# Patient Record
Sex: Female | Born: 1950 | Race: White | Hispanic: No | Marital: Single | Smoking: Never smoker
Health system: Southern US, Community
[De-identification: ages and names within clinical notes are randomized; demographics above are authoritative.]

## PROBLEM LIST (undated history)

## (undated) DIAGNOSIS — E785 Hyperlipidemia, unspecified: Secondary | ICD-10-CM

## (undated) DIAGNOSIS — F32A Depression, unspecified: Secondary | ICD-10-CM

## (undated) DIAGNOSIS — E079 Disorder of thyroid, unspecified: Secondary | ICD-10-CM

## (undated) DIAGNOSIS — I10 Essential (primary) hypertension: Secondary | ICD-10-CM

## (undated) DIAGNOSIS — K219 Gastro-esophageal reflux disease without esophagitis: Secondary | ICD-10-CM

## (undated) DIAGNOSIS — F329 Major depressive disorder, single episode, unspecified: Secondary | ICD-10-CM

## (undated) HISTORY — DX: Other disorders of phosphorus metabolism: E83.39

## (undated) HISTORY — DX: Disorder of thyroid, unspecified: E07.9

## (undated) HISTORY — PX: NASAL SINUS SURGERY: SHX719

## (undated) HISTORY — PX: KNEE SURGERY: SHX244

## (undated) HISTORY — PX: TONSILLECTOMY: SUR1361

## (undated) HISTORY — PX: APPENDECTOMY: SHX54

## (undated) HISTORY — PX: ABDOMINAL HYSTERECTOMY: SHX81

## (undated) HISTORY — DX: Essential (primary) hypertension: I10

## (undated) HISTORY — DX: Hyperlipidemia, unspecified: E78.5

---

## 2004-12-20 ENCOUNTER — Ambulatory Visit: Payer: Self-pay | Admitting: Family Medicine

## 2006-01-15 ENCOUNTER — Ambulatory Visit: Payer: Self-pay

## 2007-02-08 ENCOUNTER — Ambulatory Visit: Payer: Self-pay | Admitting: Family Medicine

## 2008-02-16 ENCOUNTER — Ambulatory Visit: Payer: Self-pay | Admitting: Family Medicine

## 2008-10-30 ENCOUNTER — Ambulatory Visit: Payer: Self-pay | Admitting: Family Medicine

## 2009-06-13 ENCOUNTER — Ambulatory Visit (HOSPITAL_COMMUNITY): Admission: RE | Admit: 2009-06-13 | Discharge: 2009-06-13 | Payer: Self-pay | Admitting: Surgery

## 2009-06-25 ENCOUNTER — Ambulatory Visit (HOSPITAL_COMMUNITY): Admission: RE | Admit: 2009-06-25 | Discharge: 2009-06-25 | Payer: Self-pay | Admitting: Surgery

## 2009-07-23 ENCOUNTER — Encounter: Admission: RE | Admit: 2009-07-23 | Discharge: 2009-07-23 | Payer: Self-pay | Admitting: Surgery

## 2010-03-07 ENCOUNTER — Encounter
Admission: RE | Admit: 2010-03-07 | Discharge: 2010-03-12 | Payer: Self-pay | Source: Home / Self Care | Attending: Surgery | Admitting: Surgery

## 2010-03-21 ENCOUNTER — Ambulatory Visit (HOSPITAL_COMMUNITY)
Admission: RE | Admit: 2010-03-21 | Discharge: 2010-03-21 | Disposition: A | Discharge status: Home / Self Care | Payer: BC Managed Care – HMO | Source: Ambulatory Visit | Attending: Surgery | Admitting: Surgery

## 2010-03-21 ENCOUNTER — Encounter (HOSPITAL_COMMUNITY): Payer: BC Managed Care – HMO

## 2010-03-21 DIAGNOSIS — Z01818 Encounter for other preprocedural examination: Secondary | ICD-10-CM | POA: Insufficient documentation

## 2010-03-21 LAB — DIFFERENTIAL
Basophils Absolute: 0 10*3/uL (ref 0.0–0.1)
Basophils Relative: 0 % (ref 0–1)
Eosinophils Relative: 3 % (ref 0–5)
Lymphocytes Relative: 28 % (ref 12–46)
Lymphs Abs: 2 10*3/uL (ref 0.7–4.0)
Monocytes Absolute: 0.7 10*3/uL (ref 0.1–1.0)
Monocytes Relative: 10 % (ref 3–12)
Neutrophils Relative %: 59 % (ref 43–77)

## 2010-03-21 LAB — COMPREHENSIVE METABOLIC PANEL
ALT: 26 U/L (ref 0–35)
AST: 25 U/L (ref 0–37)
Albumin: 4.5 g/dL (ref 3.5–5.2)
CO2: 29 mEq/L (ref 19–32)
Creatinine, Ser: 0.83 mg/dL (ref 0.4–1.2)
GFR calc non Af Amer: >60 mL/min (ref >60)
Glucose, Bld: 85 mg/dL (ref 70–99)
Total Bilirubin: 0.7 mg/dL (ref 0.3–1.2)
Total Protein: 7.6 g/dL (ref 6.0–8.3)

## 2010-03-21 LAB — SURGICAL PCR SCREEN
MRSA, PCR: NEGATIVE
Staphylococcus aureus: NEGATIVE

## 2010-03-21 LAB — CBC
HCT: 41.2 % (ref 36.0–46.0)
Hemoglobin: 14.7 g/dL (ref 12.0–15.0)
MCHC: 35.7 g/dL (ref 30.0–36.0)
MCV: 87.7 fL (ref 78.0–100.0)

## 2010-03-26 ENCOUNTER — Ambulatory Visit (HOSPITAL_COMMUNITY)
Admission: RE | Admit: 2010-03-26 | Discharge: 2010-03-27 | Disposition: A | Discharge status: Home / Self Care | Payer: BC Managed Care – HMO | Attending: Surgery | Admitting: Surgery

## 2010-03-26 DIAGNOSIS — K219 Gastro-esophageal reflux disease without esophagitis: Secondary | ICD-10-CM | POA: Insufficient documentation

## 2010-03-26 DIAGNOSIS — K449 Diaphragmatic hernia without obstruction or gangrene: Secondary | ICD-10-CM | POA: Insufficient documentation

## 2010-03-26 DIAGNOSIS — M129 Arthropathy, unspecified: Secondary | ICD-10-CM | POA: Insufficient documentation

## 2010-03-26 DIAGNOSIS — Z8543 Personal history of malignant neoplasm of ovary: Secondary | ICD-10-CM | POA: Insufficient documentation

## 2010-03-26 DIAGNOSIS — Z6837 Body mass index (BMI) 37.0-37.9, adult: Secondary | ICD-10-CM | POA: Insufficient documentation

## 2010-03-26 DIAGNOSIS — K432 Incisional hernia without obstruction or gangrene: Secondary | ICD-10-CM | POA: Insufficient documentation

## 2010-03-26 DIAGNOSIS — J40 Bronchitis, not specified as acute or chronic: Secondary | ICD-10-CM | POA: Insufficient documentation

## 2010-03-26 DIAGNOSIS — E039 Hypothyroidism, unspecified: Secondary | ICD-10-CM | POA: Insufficient documentation

## 2010-03-26 DIAGNOSIS — Z87442 Personal history of urinary calculi: Secondary | ICD-10-CM | POA: Insufficient documentation

## 2010-03-26 DIAGNOSIS — I10 Essential (primary) hypertension: Secondary | ICD-10-CM | POA: Insufficient documentation

## 2010-03-26 DIAGNOSIS — E7800 Pure hypercholesterolemia, unspecified: Secondary | ICD-10-CM | POA: Insufficient documentation

## 2010-03-27 ENCOUNTER — Observation Stay (HOSPITAL_COMMUNITY): Payer: BC Managed Care – HMO

## 2010-03-27 LAB — DIFFERENTIAL
Monocytes Relative: 10 % (ref 3–12)
Neutro Abs: 7 10*3/uL (ref 1.7–7.7)
Neutrophils Relative %: 70 % (ref 43–77)

## 2010-03-27 LAB — CBC: RBC: 4.1 MIL/uL (ref 3.87–5.11)

## 2010-04-08 ENCOUNTER — Encounter: Payer: BC Managed Care – HMO | Attending: Surgery | Admitting: *Deleted

## 2010-04-08 DIAGNOSIS — Z09 Encounter for follow-up examination after completed treatment for conditions other than malignant neoplasm: Secondary | ICD-10-CM | POA: Insufficient documentation

## 2010-04-08 DIAGNOSIS — Z9884 Bariatric surgery status: Secondary | ICD-10-CM | POA: Insufficient documentation

## 2010-04-08 DIAGNOSIS — Z713 Dietary counseling and surveillance: Secondary | ICD-10-CM | POA: Insufficient documentation

## 2010-04-23 NOTE — Discharge Summary (Signed)
[name], [name]              ACCOUNT NO.:  [id number]  MEDICAL RECORD NO.:  [id number]           PATIENT TYPE:  O  LOCATION:  XRAY                         FACILITY:  [facility]  PHYSICIAN:  Sandria Bales. Ezzard Standing, M.D.  DATE OF BIRTH:  1951-01-06  DATE OF ADMISSION:  03/21/2010 DATE OF DISCHARGE:  03/22/2010                              DISCHARGE SUMMARY  DISCHARGE DIAGNOSES: 1. Morbid obesity with a weight of 180, BMI of 37.6. 2. History of X-linked hypophosphatemia. 3. Hypothyroidism. 4. Hypertension. 5. Hypercholesterolemia. 6. Gastroesophageal reflux associated with bronchitis. 7. History kidney stones. 8. Arthritis. 9. History of ovarian cancer. 10.Ventral incisional hernia. 11.Hiatal hernia.  OPERATIONS PERFORMED:  The patient had a Lap-Band with an AP standard band and a hiatal hernia repair by Dr. Algis Downs. Ezzard Standing on March 26, 2010.  HISTORY OF ILLNESS:  [name] is a 59 year old white female who sees Dr. Lorie Phenix as her primary medical doctor and Dr. Tonia Brooms of [facility] for management of her X-linked hypophosphatemia.  She has been through our preop bariatric program and is interested in a Lap-Band and we have discussed with her the indications and potential complications of this surgery.  COMORBID CONDITIONS: 1. She has X-linked hypophosphatemia or XLH, followed by Dr. Jolaine Click     in [place]. 2. She has hypothyroidism. 3. She has hypertension. 4. She has hypercholesterolemia. 5. She has bronchitis felt to be possibly secondary to     gastroesophageal reflux. 6. She has history of kidney stones. 7. She has arthritis. 8. She has had history ovarian cancer for which she is disease free at     this time.  HOSPITAL COURSE:  She is taken to the operating room on the day of admission where she underwent a laparoscopic band placement with an AP standard band.  She was found to have a hiatal hernia at that time and had 2 posterior sutures for  posterior repair of her hiatus.  She also had a small ventral incisional hernia through her low midline incision. These ventral incisional hernias were left alone until they are either symptomatic or cause more trouble.  Postoperatively, she has done well.  She is now 1 day postop.  Her hemoglobin is 12, white blood count is 10,000.  Her KUB showed good position of the band.  She is having a little bit of nausea but otherwise keeping liquids down.  We are going to give her some protein before she leaves and then let her go home later today.  DISCHARGE INSTRUCTIONS:  No driving for 3-4 days.  She will be out of work for about 2 weeks.  She is to go home on a postop band diet with advancing her diet by nutrition.  She has appointment for nutrition in about 2 weeks.  She can shower starting tomorrow.   She is to see me back in 2 weeks. She will be given Roxicet for pain to go home with and Zofran for nausea.  DISCHARGE MEDICATIONS:  She is to resume her home medications which include: 1. Menest 1.5. 2. Estrogen. 3. She is on vitamins. 4. She will take her  triamterene hydrochlorothiazide. 5. Synthroid 125 mcg daily. 6. Simvastatin 20 mg daily. 7. Fenofibrate 135 mg daily.  I will defre to Dr. Jolaine Click who will be managing her calcium and phosphorus requirements because of her XLH history.   Sandria Bales. Ezzard Standing, M.D., FACS   DHN/MEDQ  D:  03/27/2010  T:  03/27/2010  Job:  [id number]  cc:   Lorie Phenix Fax: [phone]  Dr. Tonia Brooms  Vilinda Flake, Ph.D. Fax: [phone]  Electronically Signed by Ovidio Kin M.D. on 04/23/2010 29:56:21 PM

## 2010-04-23 NOTE — Op Note (Signed)
[name], [name]              ACCOUNT NO.:  [id number]  MEDICAL RECORD NO.:  [id number]           PATIENT TYPE:  O  LOCATION:  XRAY                         FACILITY:  [facility]  PHYSICIAN:  Sandria Bales. Ezzard Standing, M.D.  DATE OF BIRTH:  1951/01/31  DATE OF PROCEDURE:  03/21/2010                              OPERATIVE REPORT   PREOPERATIVE DIAGNOSES:  Morbid obesity.  Weight 179.  Body Mass Index 37.6.  Hiatal hernia.  POSTOPERATIVE DIAGNOSES:  Morbid obesity (Weight 179, Body Mass Index 37.6), hiatal hernia, ventral incisional hernia.  PROCEDURE:  Laparoscopic band placement with an AP standard band, subcutaneous placement of the port, hiatal hernia repair with posterior crural closure.  SURGEON:  Sandria Bales. Ezzard Standing, M.D.  FIRST ASSISTANT:  Mary Sella. Andrey Campanile, M.D.  ANESTHESIA:  General endotracheal.  ESTIMATED BLOOD LOSS:  Minimal.  INDICATION FOR PROCEDURE:  Ms. Salado is a 60 year old white female who sees Dr. Vic Ripper as her primary medical doctor and Dr. Tonia Brooms at [facility] for management of X-linked hypophosphatemia.  She has been obese or morbidly obese much of her adult life.  Completed our preoperative bariatric program and is interested in laparoscopic band placement.  I discussed with her about the placement of the laparoscopic band.  The risks of surgery include bleeding, infection, bowel injury, slippage, erosion of the band and long-term nutritional consequences.  She was also noted to have a hiatal hernia on her preoperative evaluation.  She does have symptoms of reflux and therefore we will look at repairing this at the same time as we place the band.  Also of note, she has X-linked hypophosphatemia that is followed by Dr. Jolaine Click and we were in discussion about postoperative management of her nutrition and I will defer to Dr. Jolaine Click to manage her oral electrolytes, such as calcium and phosphorus, in her postoperative course.  OPERATIVE NOTE:  The  patient was placed in the supine position and given a general endotracheal anesthetic supervised by Dr. Lucille Passy. She was given 1 gram of Ancef at the initiation of the procedure and had PAS stockings in place.  Her abdomen was prepped with ChloraPrep and sterilely draped.    A time-out was held and the surgical checklist run.  I made a left upper quadrant incision and using the 11-mm Ethicon Optiview trocar, I inserted this into the abdominal cavity.  I then carried out an abdominal exploration.  The right and left lobes of the liver were unremarkable.  The stomach was unremarkable.  She did have some adhesions to her anterior abdominal wall.  She also had a small hernia near the umbilicus and near the upper end of her previous midline incision.  I then placed five additional trocars of 5-mm subxiphoid, a trocar for the liver retractor, a 15-mm right subcostal trocar for band placement, an 11-mm left paramedian trocar, an 11-mm right paramedian trocar for camera placement and a 5-mm left lateral trocar for my assistant to have two hands.  Even though we identified the ventral hernias, I will leave these alone until they either become symptomatic or she has lost weight.  I did not  think that taking down the adhesions and fixing these at this time would be appropriate.  I then turned my attention to her stomach with the liver retractor in the left lobe of the liver.  She does have a dimple at her hiatus consistent with a hiatal hernia.  Anesthesia passed the sizing tube down into the stomach.  We blew this up with 15 cc of air and this was then pulled up through at the esophageal hiatus and went up into the esophagus, so she had a fairly good-sized hiatal hernia.  So I then started dissection along the right crus.  She had some fat which had protruded up into the chest and I reduced this.  I actually resected some of this fat.  I then identified the right and left crus and  I placed two sutures using 2-0 Ethibond suture with tie knots to hold the sutures in place.  This then closed the hiatus down.  I then reinserted the sizing tube into the stomach and insufflated this with 15 cc of air.  At this time the sizing tube held up at the hiatus, consistent with an adequate closure of her esophageal or diaphragmatic hiatus.  I then used an AP standard band.  I did defat some of the anterior stomach for exposure purposes and to get this out of the way.  I used a finger dissector and passed this around the proximal stomach and then captured the laparoscopic band and pulled this around the stomach.  This was then tightened over the sizing tube.  The sizing tube was then removed.  I then imbricated the stomach along the left lateral aspect of the band at three sites using 2-0 Ethibond suture.  The band lay in a good position.  Photos were taken.  The hiatal hernia repair looked good.  The Silastic tube was then brought out through the right paramedian incision.  I removed the liver retractor.  I inspected the abdomen, which looked good, and all of the trocars were removed.    I then developed a pocket lateral to the right abdominal incision and attached the port to the tubing of the band.  The port had mesh backing sewn in place.  I then closed that incision with interrupted 2-0 Vicryl sutures and then closed the skin at each site with a 5-0 Vicryl suture.  I painted this with a combination of Octylseal and Dermabond.  The patient tolerated the procedure well and was transported to the recovery room in good condition.   Sandria Bales. Ezzard Standing, M.D., FACS   DHN/MEDQ  D:  03/26/2010  T:  03/26/2010  Job:  [id number]  cc:   Lorie Phenix Fax: [phone]  Vilinda Flake, Ph.D. Fax: [phone]  Tonia Brooms, M.D.  Electronically Signed by Ovidio Kin M.D. on 04/23/2010 29:56:21 PM

## 2010-05-21 ENCOUNTER — Encounter: Payer: BC Managed Care – HMO | Attending: Surgery | Admitting: *Deleted

## 2010-05-21 DIAGNOSIS — Z09 Encounter for follow-up examination after completed treatment for conditions other than malignant neoplasm: Secondary | ICD-10-CM | POA: Insufficient documentation

## 2010-05-21 DIAGNOSIS — Z9884 Bariatric surgery status: Secondary | ICD-10-CM | POA: Insufficient documentation

## 2010-05-21 DIAGNOSIS — Z713 Dietary counseling and surveillance: Secondary | ICD-10-CM | POA: Insufficient documentation

## 2010-06-28 IMAGING — US US ABDOMEN COMPLETE
1 series · 14 of 25 positions shown · non-contrast
Comparison: None.

CLINICAL DATA: Morbid obesity, pre bariatric surgery screening
exam.

COMPLETE ABDOMINAL ULTRASOUND

[Series 1: us abdomen complete · 0.20mm/px · 14 of 71 slices shown]
[im 1/71]
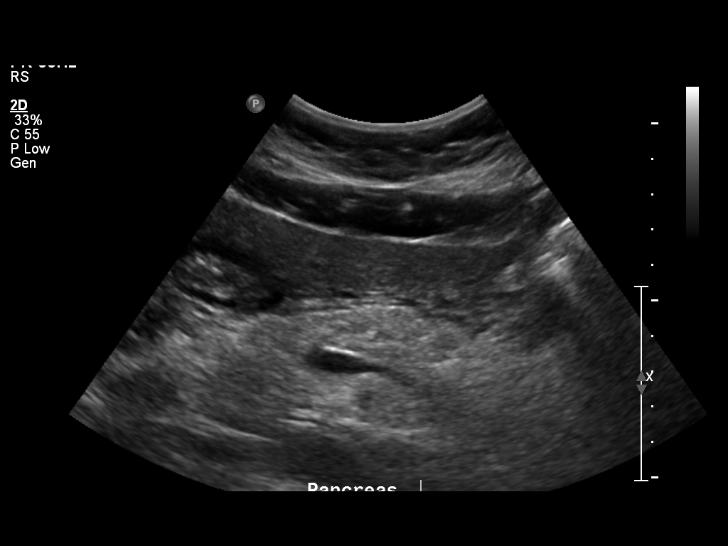
[im 6/71]
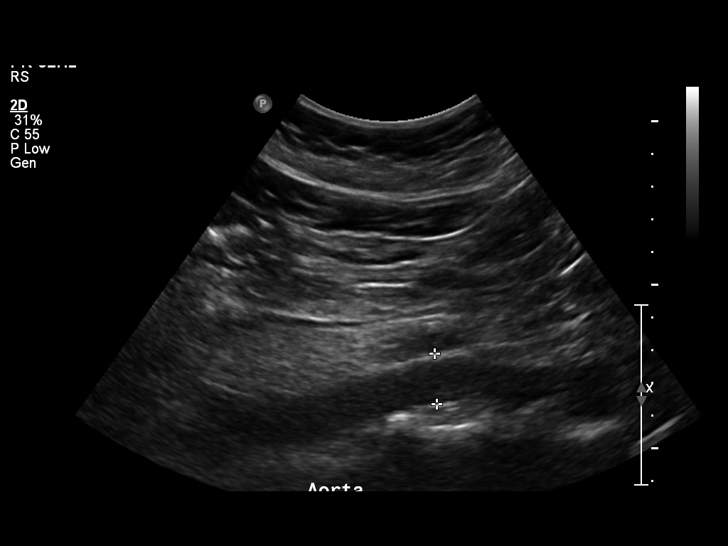
[im 12/71]
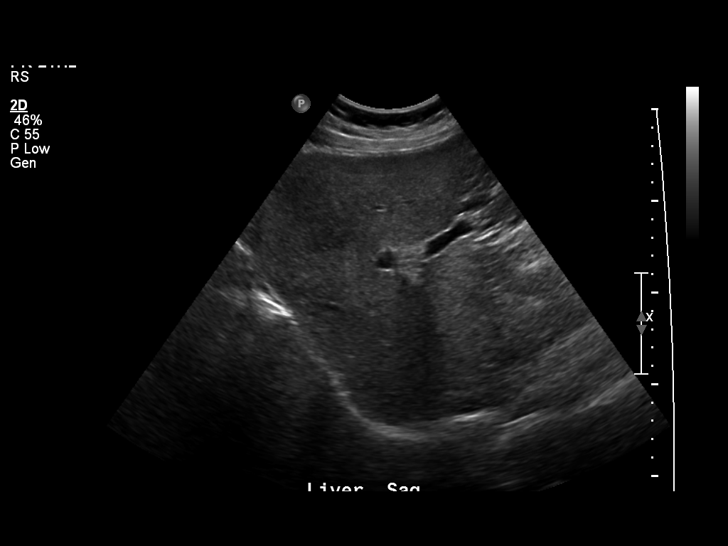
[im 18/71]
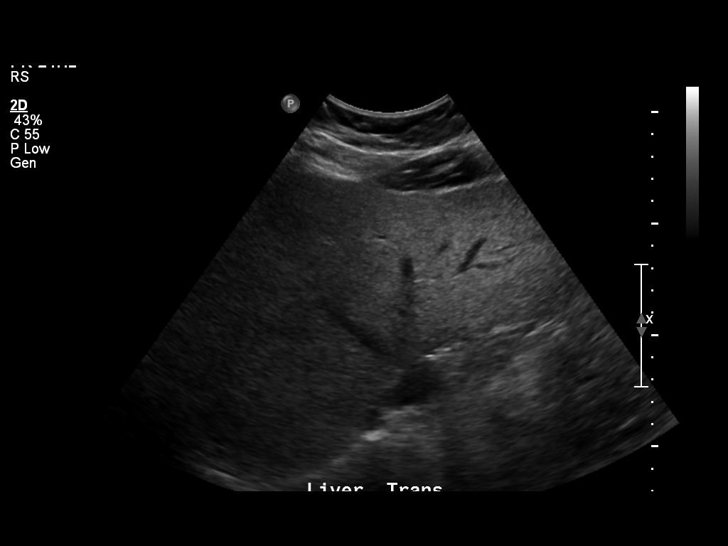
[im 24/71]
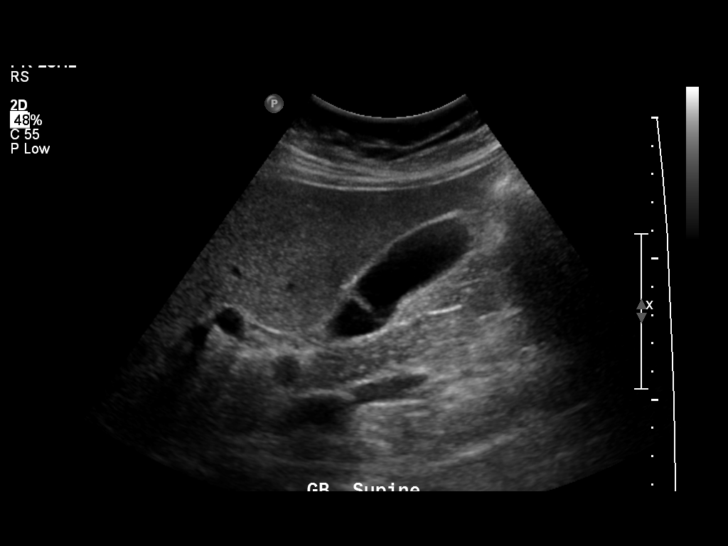
[im 27/71]
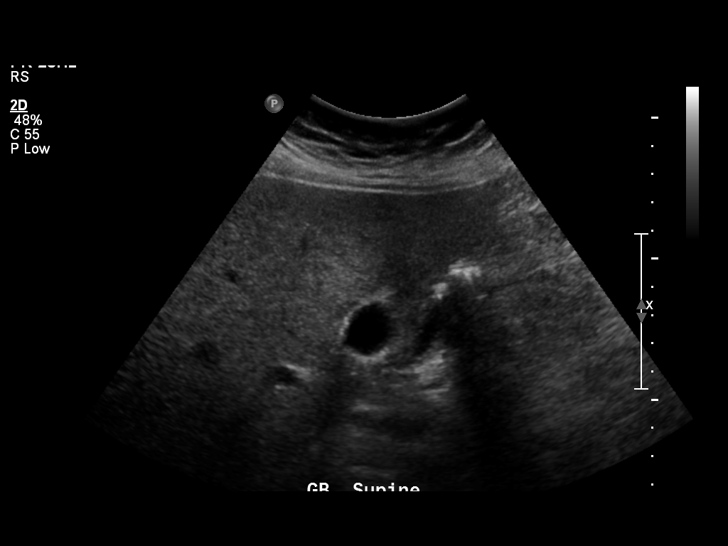
[im 33/71]
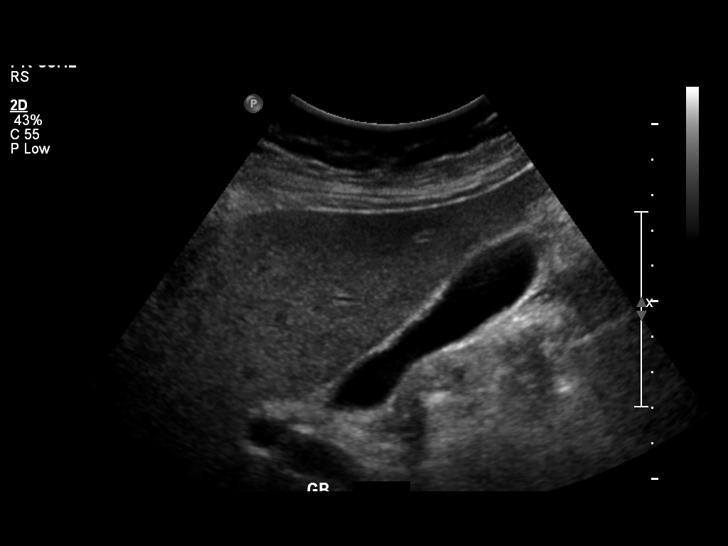
[im 38/71]
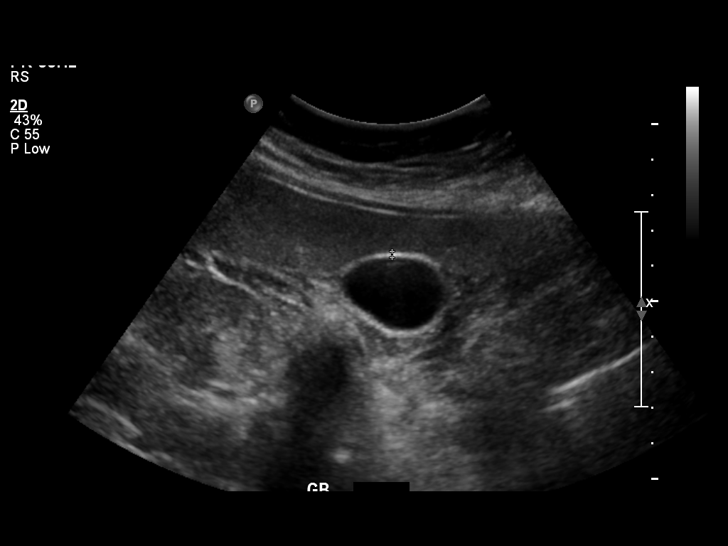
[im 44/71]
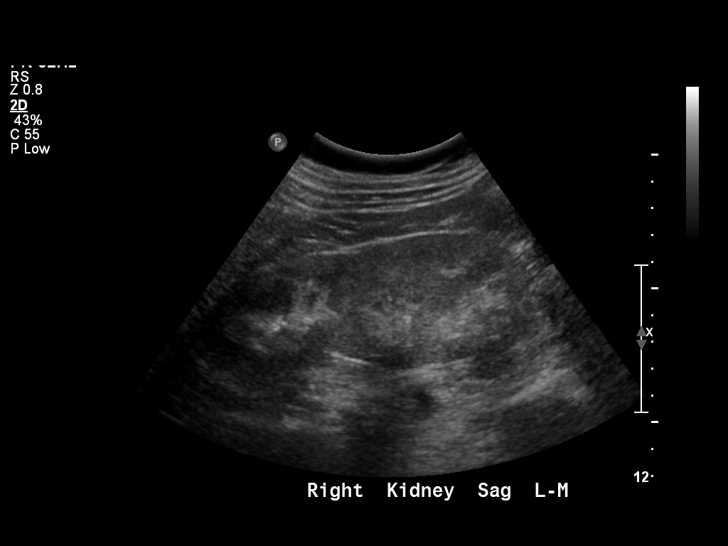
[im 47/71]
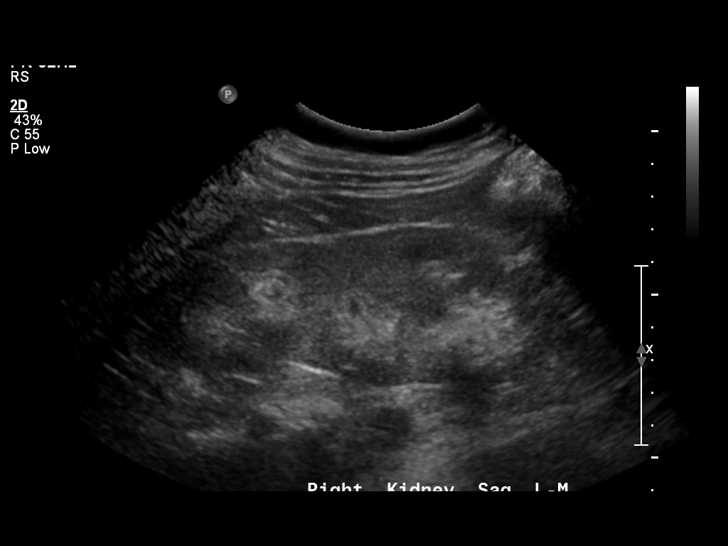
[im 53/71]
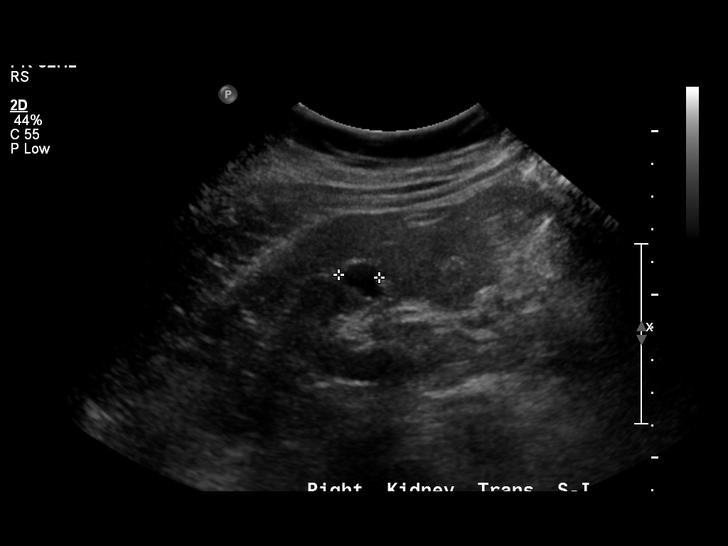
[im 59/71]
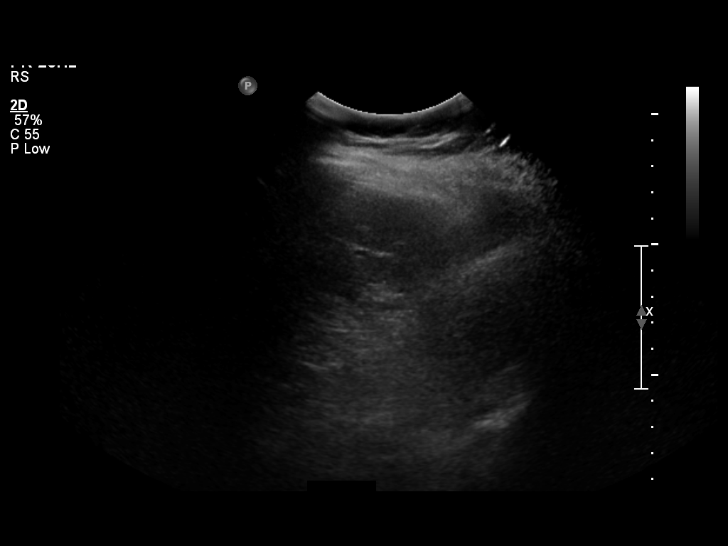
[im 65/71]
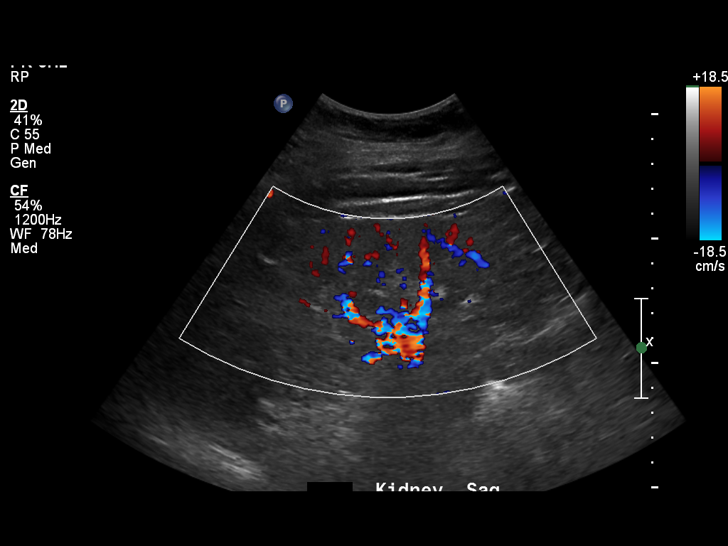
[im 71/71]
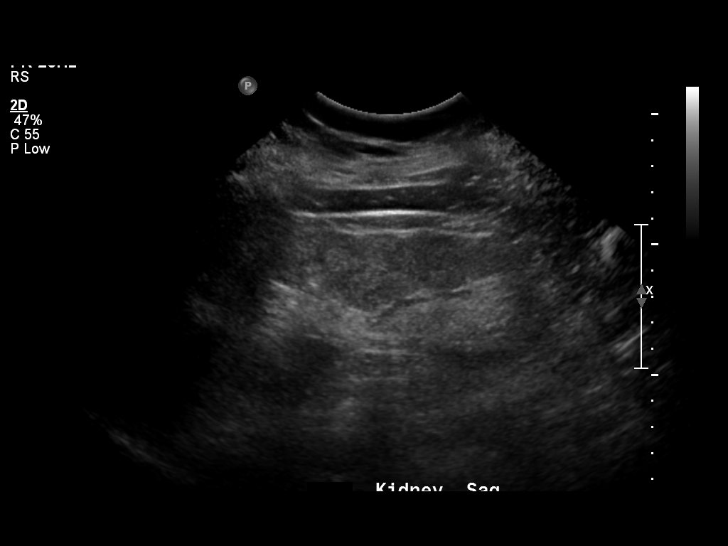

[14 of 25 positions shown; findings below may reference images not displayed]

FINDINGS: Gallbladder:  No gallstones, gallbladder wall thickening, or
pericholecystic fluid.

Common bile duct:  4 mm.

Liver:  The liver is increased in echogenicity with poor sound
through transmission, suggestive of fatty infiltration.

IVC:  Appears normal.

Pancreas:  No focal abnormality seen.

Spleen:  4.2 cm.  Normal.

Right Kidney:  12.4 cm.  1.3 x 1.2 x 1.2 cm simple appearing right
upper renal pole cyst.

Left Kidney:  12.9 cm.  No hydronephrosis or focal mass.

Both renal cortices are preserved but increased in echogenicity.

Abdominal aorta:  No aneurysm identified.
IMPRESSION: No acute abnormality.

Fatty liver.

Mildly increased renal cortical echogenicity without hydronephrosis
or cortical thinning.  This could signify medical renal disease but
is nonspecific.

## 2010-07-02 ENCOUNTER — Encounter: Payer: BC Managed Care – PPO | Attending: Obstetrics | Admitting: *Deleted

## 2010-07-02 DIAGNOSIS — Z09 Encounter for follow-up examination after completed treatment for conditions other than malignant neoplasm: Secondary | ICD-10-CM | POA: Insufficient documentation

## 2010-07-02 DIAGNOSIS — Z9884 Bariatric surgery status: Secondary | ICD-10-CM | POA: Insufficient documentation

## 2010-07-02 DIAGNOSIS — Z713 Dietary counseling and surveillance: Secondary | ICD-10-CM | POA: Insufficient documentation

## 2010-08-09 ENCOUNTER — Ambulatory Visit (INDEPENDENT_AMBULATORY_CARE_PROVIDER_SITE_OTHER): Payer: BC Managed Care – PPO | Admitting: Physician Assistant

## 2010-08-09 ENCOUNTER — Encounter (INDEPENDENT_AMBULATORY_CARE_PROVIDER_SITE_OTHER): Payer: Self-pay

## 2010-08-09 VITALS — BP 118/78 | Ht <= 58 in | Wt 168.2 lb

## 2010-08-09 DIAGNOSIS — N951 Menopausal and female climacteric states: Secondary | ICD-10-CM

## 2010-08-09 DIAGNOSIS — Z4651 Encounter for fitting and adjustment of gastric lap band: Secondary | ICD-10-CM

## 2010-08-09 DIAGNOSIS — Z780 Asymptomatic menopausal state: Secondary | ICD-10-CM | POA: Insufficient documentation

## 2010-08-09 DIAGNOSIS — Z79890 Hormone replacement therapy (postmenopausal): Secondary | ICD-10-CM

## 2010-08-09 NOTE — Progress Notes (Signed)
Subjective:     Patient ID: [name], female   DOB: Apr 10, 1950, 60 y.o.   MRN: [id number]    BP 118/78  Ht 4\' 10"  (1.473 m)  Wt 168 lb 3.2 oz (76.295 kg)  BMI 35.15 kg/m2    HPI See scanned, dictated note  Review of Systems     Objective:   Physical Exam     Assessment:         Plan:

## 2010-09-13 ENCOUNTER — Encounter (INDEPENDENT_AMBULATORY_CARE_PROVIDER_SITE_OTHER): Payer: Self-pay

## 2010-09-13 ENCOUNTER — Ambulatory Visit (INDEPENDENT_AMBULATORY_CARE_PROVIDER_SITE_OTHER): Payer: BC Managed Care – PPO | Admitting: Physician Assistant

## 2010-09-13 VITALS — BP 130/78

## 2010-09-13 DIAGNOSIS — Z4651 Encounter for fitting and adjustment of gastric lap band: Secondary | ICD-10-CM

## 2010-09-13 NOTE — Progress Notes (Signed)
  HISTORY: [name] is a 60 y.o.female who received an AP-Standard lap-band in February 2012 by Dr. Ezzard Standing. She comes in today stating that for the most part her hunger is under control and her portion sizes are small but she doesn't feel the "signal" to stop eating as he feels she should. She depends on limiting the amount of food on her plate rather than a feeling of fullness to stop eating. She denies vomiting or regurgitation symptoms.  VITAL SIGNS: Filed Vitals:   09/13/10 0920  BP: 130/78    PHYSICAL EXAM: Physical exam reveals a very well-appearing 60 y.o.female in no apparent distress Neurologic: Awake, alert, oriented Psych: Bright affect, conversant Respiratory: Breathing even and unlabored. No stridor or wheezing Abdomen: Soft, nontender, nondistended to palpation. Incisions well-healed. No incisional hernias. Port easily palpated. Extremities: Atraumatic, good range of motion.  ASSESMENT: 60 y.o.  female  s/p AP-Standard lap-band. We will try an adjustment today.  PLAN: The patient's port was accessed with a 20G Huber needle without difficulty. Clear fluid was aspirated and 0.5 mL saline was added to the port to give a total volume of 5.5 mL. The patient was able to swallow water without difficulty following the procedure and was instructed to take clear liquids for the next 24-48 hours and advance slowly as tolerated.

## 2010-09-13 NOTE — Patient Instructions (Signed)
Take clear liquids for the next 48 hours. Thin protein shakes are ok to start on Saturday evening. Call us if you have persistent vomiting or regurgitation, night cough or reflux symptoms. Return as scheduled or sooner if you notice no changes in hunger/portion sizes.   

## 2010-10-01 ENCOUNTER — Ambulatory Visit: Payer: BC Managed Care – HMO | Admitting: *Deleted

## 2010-10-11 ENCOUNTER — Encounter (INDEPENDENT_AMBULATORY_CARE_PROVIDER_SITE_OTHER): Payer: BC Managed Care – PPO

## 2010-10-23 ENCOUNTER — Ambulatory Visit: Payer: Self-pay | Admitting: *Deleted

## 2010-11-12 ENCOUNTER — Ambulatory Visit: Payer: Self-pay | Admitting: *Deleted

## 2011-04-05 IMAGING — CR DG CHEST 2V
2 series · 2 of 2 positions shown · non-contrast
Comparison: None.

CLINICAL DATA: Preoperative respiratory film.

CHEST - 2 VIEW

[w chest pa]
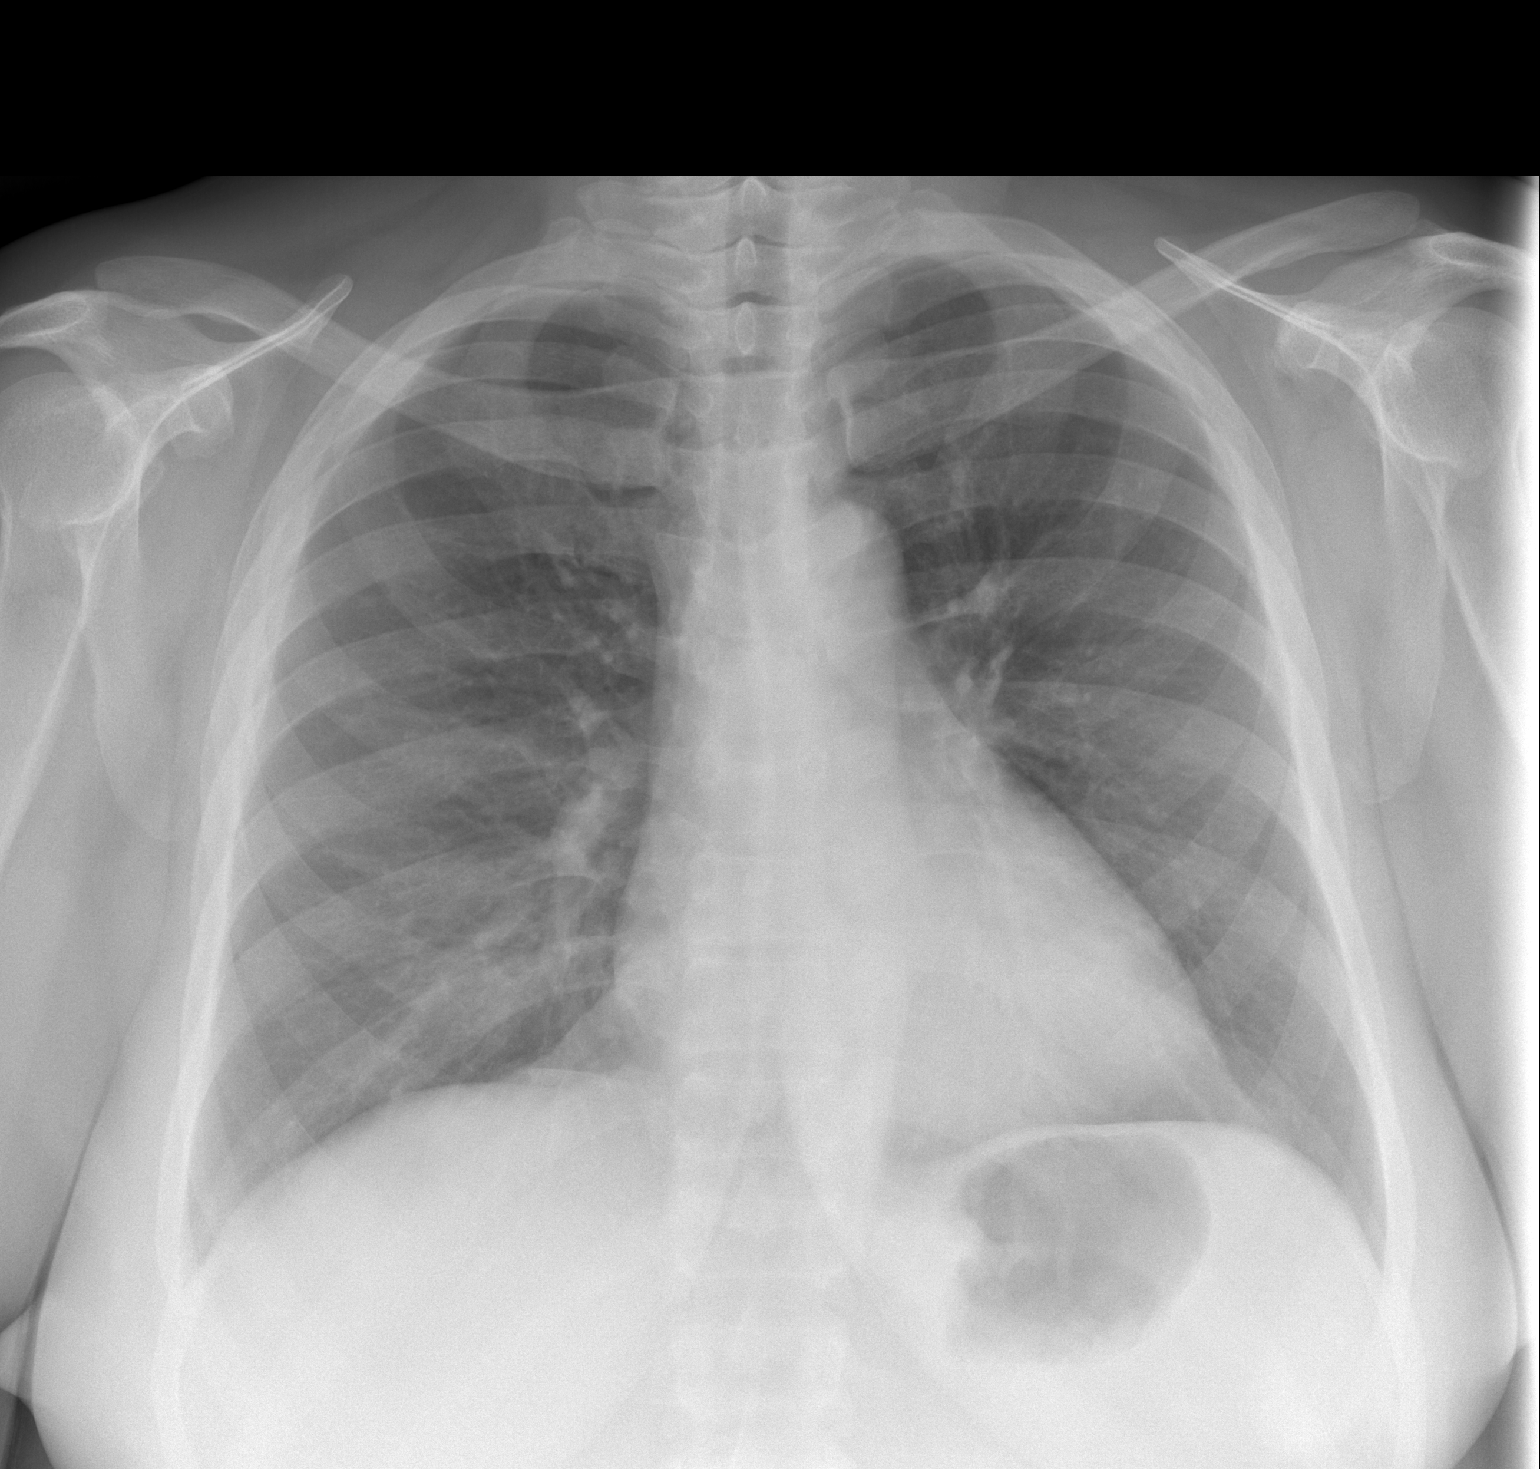

[w chest lat]
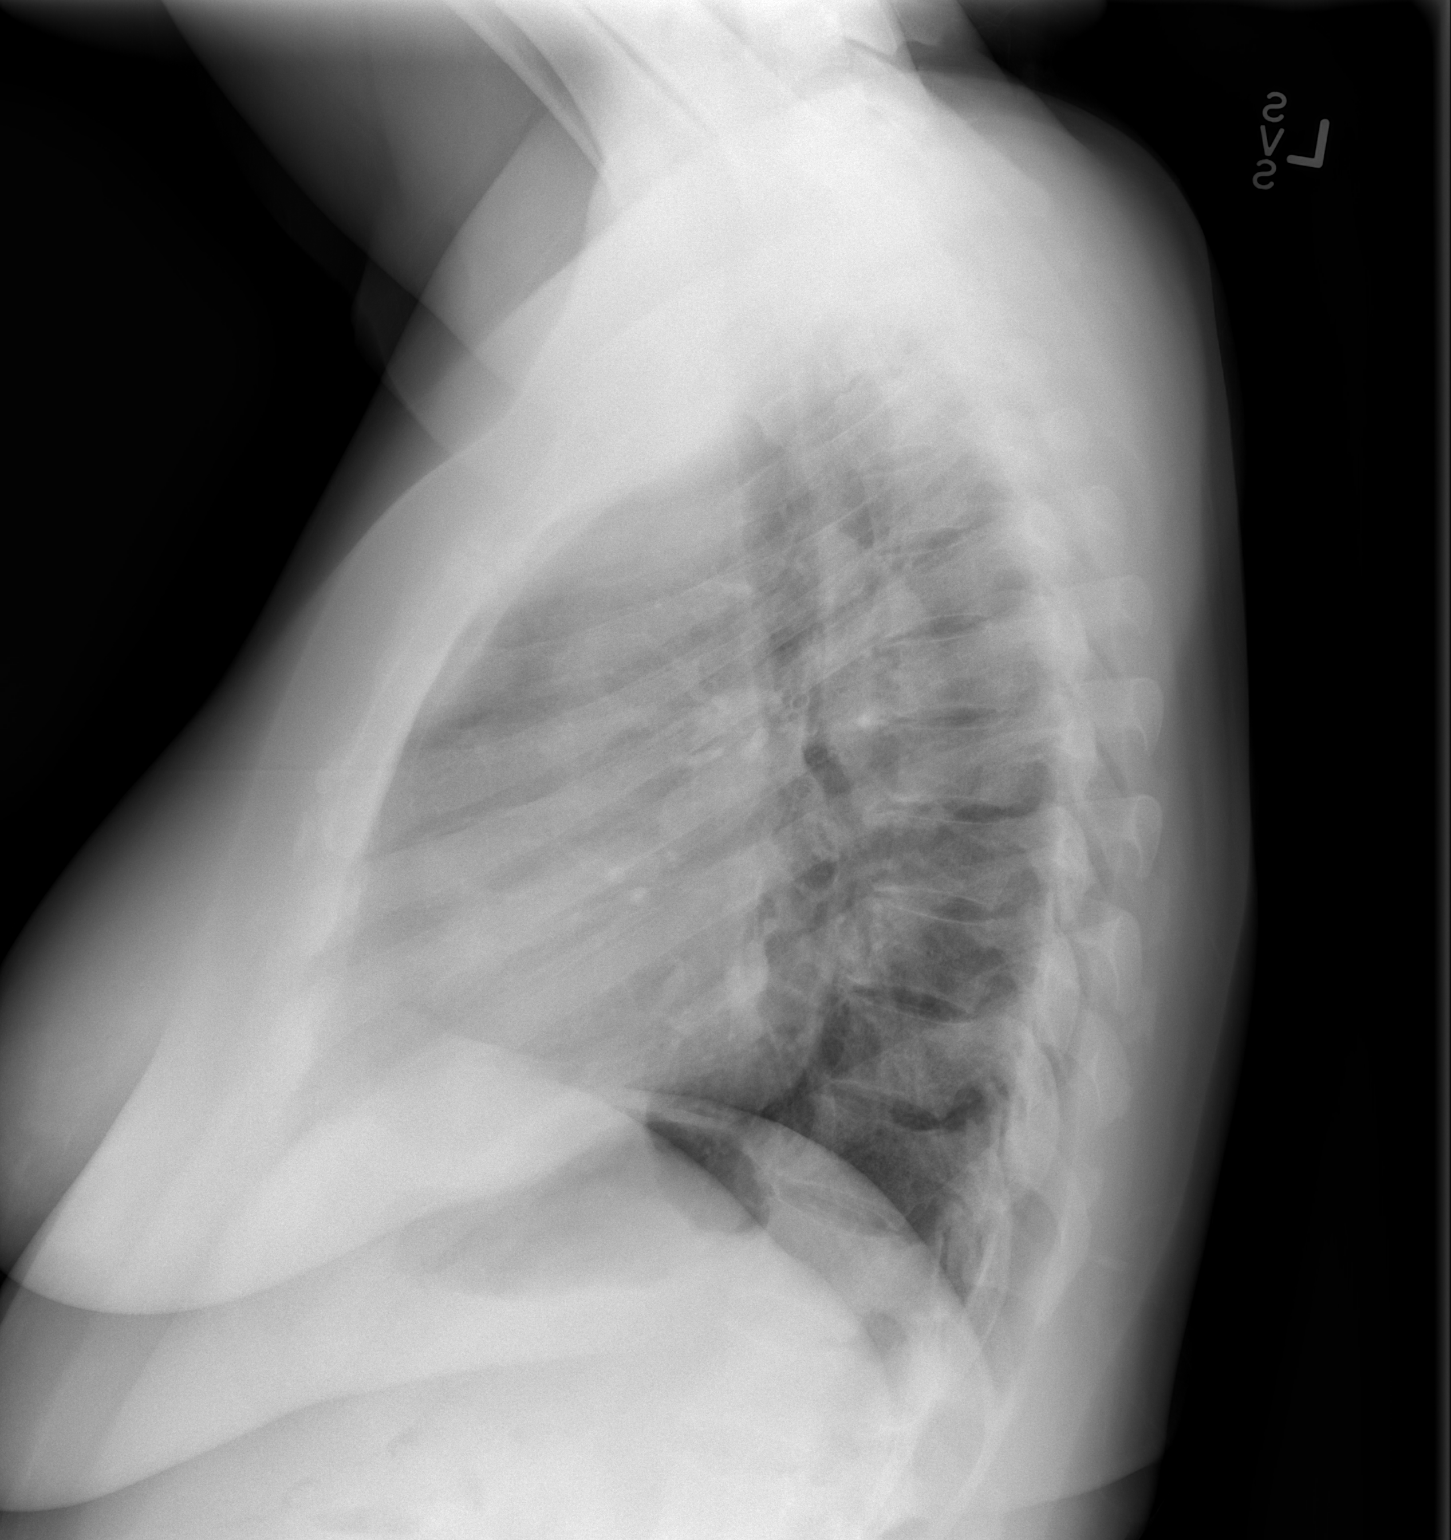

[2 of 2 positions shown; findings below may reference images not displayed]

FINDINGS: Lungs clear.  No pneumothorax or pleural effusion.  Heart
size normal.  No focal bony abnormality.
IMPRESSION: Negative chest.

## 2011-04-11 IMAGING — CR DG ABDOMEN 1V
1 series · 1 of 1 positions shown · non-contrast
Comparison: None

CLINICAL DATA: Status post gastric lap band placement.

ABDOMEN - 1 VIEW

[t abdomen supine]
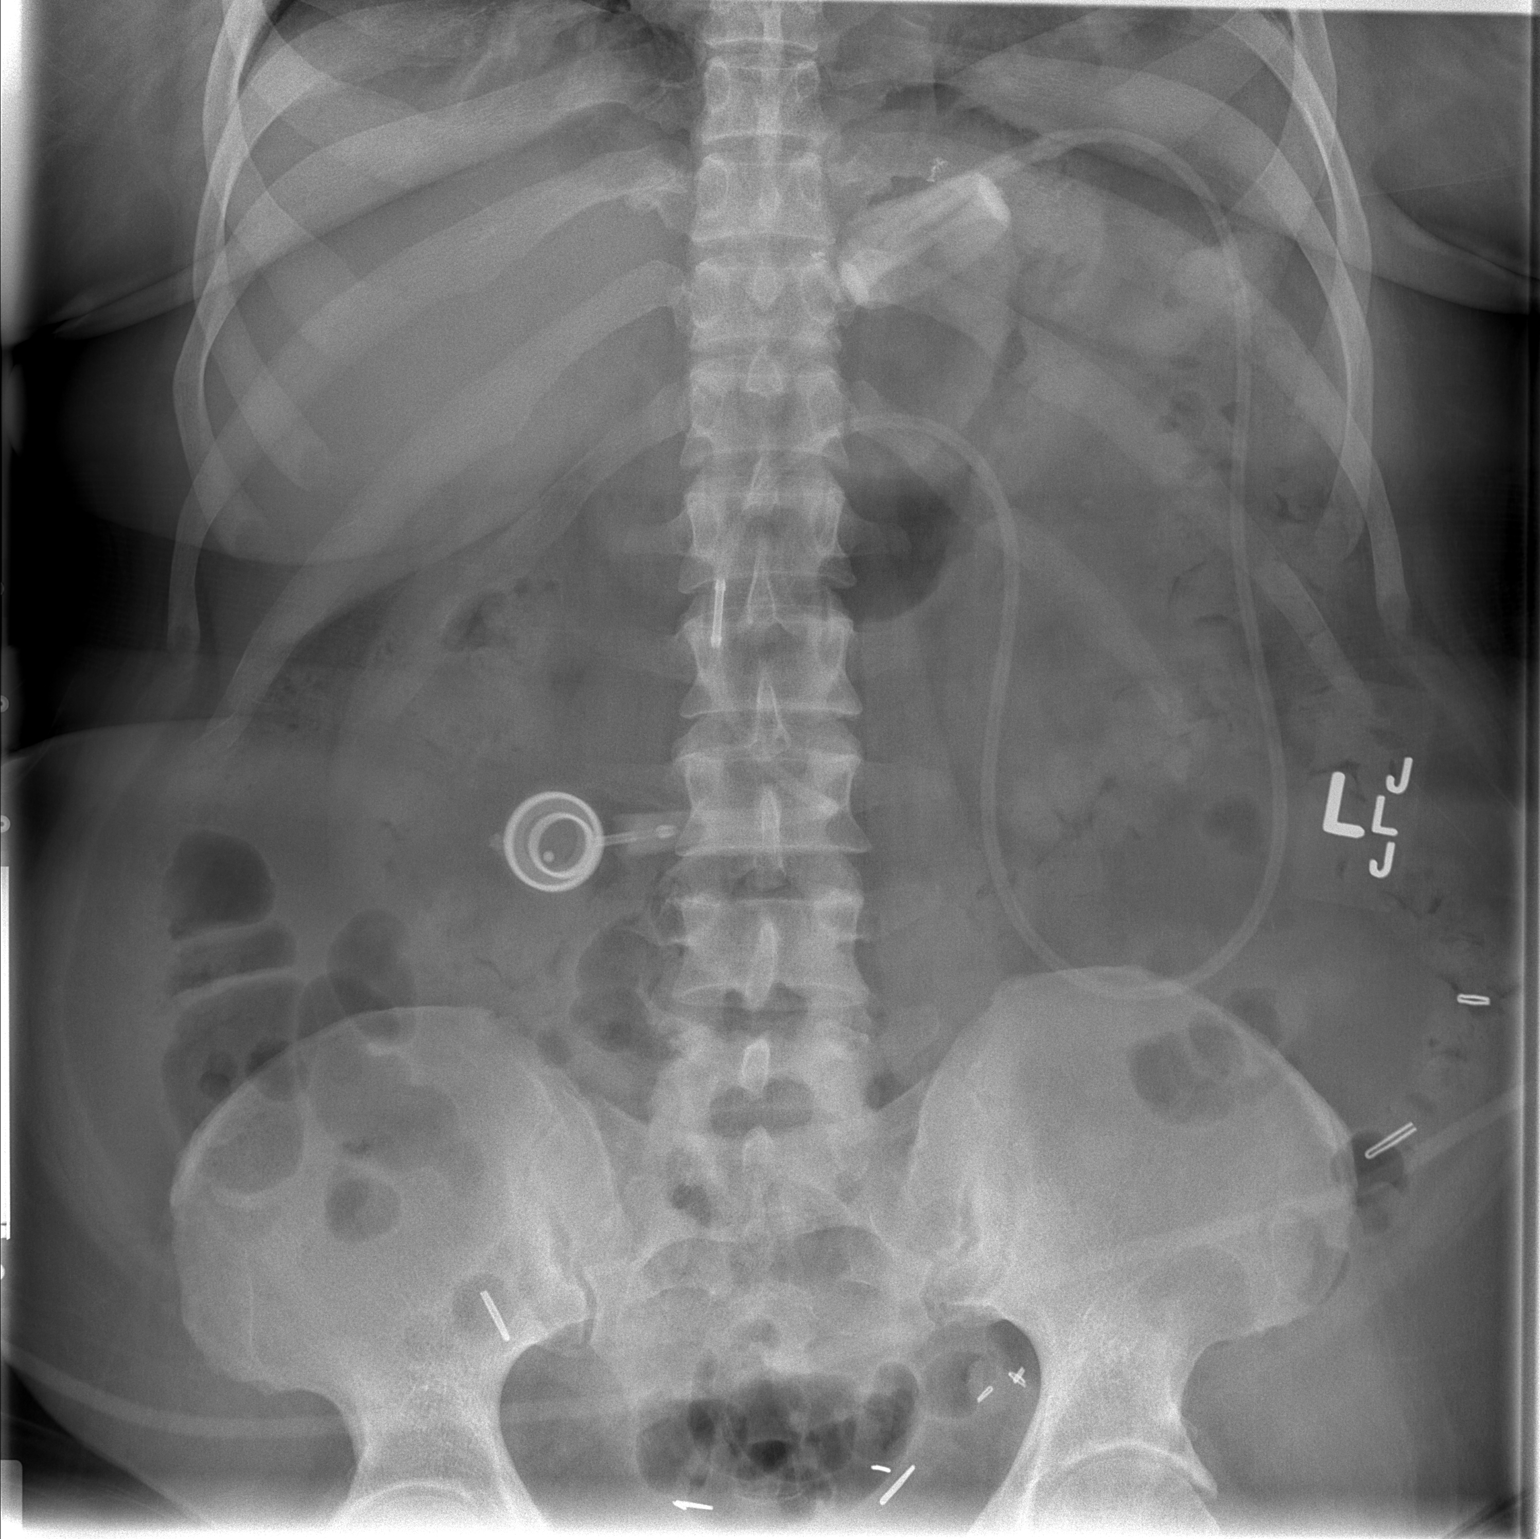

[1 of 1 positions shown; findings below may reference images not displayed]

FINDINGS: The lap band is and good position and demonstrates normal
2 o'clock 8 o'clock orientation.  The bowel gas pattern is
unremarkable.  The soft tissue shadows of the abdomen are
maintained.  Minimal right basilar atelectasis.
IMPRESSION: Lap band in appropriate position with normal orientation.

## 2011-10-31 ENCOUNTER — Telehealth (INDEPENDENT_AMBULATORY_CARE_PROVIDER_SITE_OTHER): Payer: Self-pay | Admitting: Surgery

## 2011-10-31 NOTE — Telephone Encounter (Signed)
I spoke with the patient via phone. She has been taking care of her mother who passed away this week. The patient states she would like to schedule an appointment for follow-up with Lenard Forth, PA. Appt set for 11/20/11 @ 10:45 am...cef

## 2011-11-20 ENCOUNTER — Encounter (INDEPENDENT_AMBULATORY_CARE_PROVIDER_SITE_OTHER): Payer: Self-pay

## 2011-11-20 ENCOUNTER — Ambulatory Visit (INDEPENDENT_AMBULATORY_CARE_PROVIDER_SITE_OTHER): Payer: Self-pay | Admitting: Physician Assistant

## 2011-11-20 VITALS — BP 122/80 | HR 64 | Temp 98.0°F | Resp 18 | Ht <= 58 in | Wt 151.0 lb

## 2011-11-20 DIAGNOSIS — Z4651 Encounter for fitting and adjustment of gastric lap band: Secondary | ICD-10-CM

## 2011-11-20 NOTE — Patient Instructions (Signed)
Take clear liquids tonight. Thin protein shakes are ok to start tomorrow morning. Slowly advance your diet thereafter. Call us if you have persistent vomiting or regurgitation, night cough or reflux symptoms. Return as scheduled or sooner if you notice no changes in hunger/portion sizes.  

## 2011-11-20 NOTE — Progress Notes (Signed)
  HISTORY: [name] is a 61 y.o.female who received an AP-Standard lap-band in February 2012 by Dr. Ezzard Standing. She comes in with 14 lbs weight loss in 2 months. She denies regurgitation or reflux but she does complain of increasing hunger and portion sizes. She's moving out of town soon.  VITAL SIGNS: Filed Vitals:   11/20/11 1110  BP: 122/80  Pulse: 64  Temp: 98 F (36.7 C)  Resp: 18    PHYSICAL EXAM: Physical exam reveals a very well-appearing 61 y.o.female in no apparent distress Neurologic: Awake, alert, oriented Psych: Bright affect, conversant Respiratory: Breathing even and unlabored. No stridor or wheezing Abdomen: Soft, nontender, nondistended to palpation. Incisions well-healed. No incisional hernias. Port easily palpated. Extremities: Atraumatic, good range of motion.  ASSESMENT: 61 y.o.  female  s/p AP-Standard lap-band.   PLAN: The patient's port was accessed with a 20G Huber needle without difficulty. Clear fluid was aspirated and 0.25 mL saline was added to the port to give a total predicted volume of 5.75 mL. The patient was able to swallow water without difficulty following the procedure and was instructed to take clear liquids for the next 24-48 hours and advance slowly as tolerated.

## 2012-01-05 ENCOUNTER — Telehealth (INDEPENDENT_AMBULATORY_CARE_PROVIDER_SITE_OTHER): Payer: Self-pay | Admitting: General Surgery

## 2012-01-05 NOTE — Telephone Encounter (Signed)
Pt called to ask if OK to try OTC meds for heartburn/ indigestion.  Stated OK to try TUMS or Rolaids, but avoid Pepcid or Prilosec for now.  It the TUMS or Rolaids do not work, need appt to look at lapband.  She understands and will follow up as needed.

## 2012-12-02 ENCOUNTER — Ambulatory Visit (INDEPENDENT_AMBULATORY_CARE_PROVIDER_SITE_OTHER): Payer: PRIVATE HEALTH INSURANCE | Admitting: Surgery

## 2012-12-02 ENCOUNTER — Other Ambulatory Visit (INDEPENDENT_AMBULATORY_CARE_PROVIDER_SITE_OTHER): Payer: Self-pay

## 2012-12-02 ENCOUNTER — Encounter (INDEPENDENT_AMBULATORY_CARE_PROVIDER_SITE_OTHER): Payer: Self-pay | Admitting: Surgery

## 2012-12-02 VITALS — BP 142/78 | HR 68 | Resp 16 | Ht <= 58 in | Wt 147.4 lb

## 2012-12-02 DIAGNOSIS — Z9884 Bariatric surgery status: Secondary | ICD-10-CM

## 2012-12-02 NOTE — Progress Notes (Addendum)
[facility]  Ovidio Kin, MD,  FACS [address] [place]  [address] [place], [place]    [address] Phone:  [phone] FAX:  [phone]   Re:   [name] DOB:   1950-02-16 MRN:   [id number]  ASSESSMENT AND PLAN: 1.  History of lap band, APS - 03/26/2010  Initial weight - 193, BMI - 40.5  I think the issue is that her lap band is too tight.  I removed 2.0 cc to leave her 4.0 cc in her lap band.  We'll also check a KUB on her for lap band placement.  Plan: 1) KUB of abdomen, 2) She'll check with [place] in one week to see how she is doing, 3) follow up in 6 months.  2. History of X-linked hypophosphatemia.  Was followed by Dr. Algis Downs. [place] 3. Hypertension.  4. Hypercholesterolemia.  5. Gastroesophageal reflux associated with bronchitis.  6. History kidney stones.  7. Arthritis.  8.  History of ovarian cancer.  9. Ventral incisional hernia.   HISTORY OF PRESENT ILLNESS: Chief Complaint  Patient presents with  . Lap Band Fill    [name] is a 62 y.o. (DOB: [birth year])  white  female who is a patient of Provider Not In System Dr. Odie Sera, [place] (FAX: [phone]), and comes to me today for follow up of lap band.  She came by herself.   have not seen [name] in some time.  She was last seen by Mardelle Matte on 11/2011. She is not working, has money trouble, and is being seen through the [place] system in [place].  She sees a Dr. Malen Gauze at the [facility] as her PCP.  [name] started having reflux about the time her mother died in 2011/11/15.  She thought that at first this was a reaction to her mother's death, but it persisted.  At one time, she was "vomiting" several times a day. She saw her gynecologist [facility].  She said that her chemistries were out of balance, primarily magnesium, calcium, and phosphorus. This aggravated her arthritis of her knees. She is undergone a workup for the last several months through the [place].   The patient had an upper endo by  Dr. Zenovia Jarred, [facility] on 09/04/2012.  There is mention of HH on endo, but there are no measurements.  From the retroflexed veiw, the lap band looks in good position.  She is better now. She still spits up a night, has a night cough, and vomits maybe once or twice a week.  She has had no recent x-rays in the [facility] system.  Past Medical History  Diagnosis Date  . Hypertension   . Thyroid disease   . Hyperlipidemia   . Hypophosphatemia    Review of Systems: PCP - Odie Sera, [place] (FAX: [phone]) Psych - saw Dr. Cyndia Skeeters pre op  SOCIAL HISTORY: Not working  PHYSICAL EXAM: BP 142/78  Pulse 68  Resp 16  Ht 4\' 10"  (1.473 m)  Wt 147 lb 6.4 oz (66.86 kg)  BMI 30.81 kg/m2  General: Short WF who is alert and generally healthy appearing.  HEENT: Normal. Pupils equal. Good dentition. Lungs: Clear to auscultation and symmetric breath sounds. Heart:  RRR. No murmur or rub. Abdomen: Soft.  No tenderness. No hernia. Normal bowel sounds.  Port in the RUQ seems normal.  Procedure:  I access her lap band.  She had about 1.0 cc of fluid under pressure.  I remove 2.0  cc for a remaining total of 4.0 cc.  DATA REVIEWED: Data from the [facility].   Ovidio Kin, MD,  [facility] [address] [place]  [address]   [place], [place]    [address] Phone:  [phone] FAX:  [phone]

## 2012-12-08 ENCOUNTER — Encounter (INDEPENDENT_AMBULATORY_CARE_PROVIDER_SITE_OTHER): Payer: Self-pay

## 2012-12-17 ENCOUNTER — Telehealth (INDEPENDENT_AMBULATORY_CARE_PROVIDER_SITE_OTHER): Payer: Self-pay | Admitting: *Deleted

## 2012-12-17 NOTE — Telephone Encounter (Signed)
I spoke with pt, she had her abdominal xray done at [facility] on 11/5 and has just received the disc today.  I instructed her to bring that by the office sometime next week.  She verbalized understanding and will bring it so that Dr. Ezzard Standing can look at it.

## 2012-12-29 ENCOUNTER — Telehealth (INDEPENDENT_AMBULATORY_CARE_PROVIDER_SITE_OTHER): Payer: Self-pay

## 2012-12-29 NOTE — Telephone Encounter (Signed)
Patient states she is doing "much better", [facility] she will [facility] for her 6 month follow up

## 2013-03-17 ENCOUNTER — Ambulatory Visit (INDEPENDENT_AMBULATORY_CARE_PROVIDER_SITE_OTHER): Payer: PRIVATE HEALTH INSURANCE | Admitting: Physician Assistant

## 2013-03-17 ENCOUNTER — Encounter (INDEPENDENT_AMBULATORY_CARE_PROVIDER_SITE_OTHER): Payer: Self-pay

## 2013-03-17 VITALS — BP 140/90 | HR 72 | Temp 98.0°F | Resp 14 | Ht <= 58 in | Wt 162.0 lb

## 2013-03-17 DIAGNOSIS — Z4651 Encounter for fitting and adjustment of gastric lap band: Secondary | ICD-10-CM

## 2013-03-17 NOTE — Patient Instructions (Signed)

## 2013-03-17 NOTE — Progress Notes (Signed)
  HISTORY: Kristine BlockVickie Smith is a 63 y.o.female who received an AP-Standard lap-band in February 2012 by Dr. Ezzard StandingNewman. She comes in with 14 lbs weight gain since her last visit in October when Dr. Ezzard StandingNewman removed 2 mL fluid for GERD symptoms. [facility] which [facility]. She has since started taking a PPI and H2 blocker and these have completely alleviated her symptoms. She unfortunately is having significant hunger with larger than desired portion sizes. She would like to have her band filled again to keep the weight off.  VITAL SIGNS: Filed Vitals:   03/17/13 1412  BP: 140/90  Pulse: 72  Temp: 98 F (36.7 C)  Resp: 14    PHYSICAL EXAM: Physical exam reveals a very well-appearing 63 y.o.female in no apparent distress Neurologic: Awake, alert, oriented Psych: Bright affect, conversant Respiratory: Breathing even and unlabored. No stridor or wheezing Abdomen: Soft, nontender, nondistended to palpation. Incisions well-healed. No incisional hernias. Port easily palpated. Extremities: Atraumatic, good range of motion.  ASSESMENT: 63 y.o.  female  s/p AP-Standard lap-band.   PLAN: The patient's port was accessed with a 20G Huber needle without difficulty. Clear fluid was aspirated and 1.25 mL saline was added to the port to give a total predicted volume of 5.25 mL. The patient was able to swallow water without difficulty following the procedure and was instructed to take clear liquids for the next 24-48 hours and advance slowly as tolerated. I'll have her back in 2 weeks for replacement of the rest of the fluid that was removed.

## 2013-03-31 ENCOUNTER — Encounter (INDEPENDENT_AMBULATORY_CARE_PROVIDER_SITE_OTHER): Payer: Self-pay

## 2013-03-31 ENCOUNTER — Ambulatory Visit (INDEPENDENT_AMBULATORY_CARE_PROVIDER_SITE_OTHER): Payer: Self-pay | Admitting: Physician Assistant

## 2013-03-31 VITALS — BP 140/84 | HR 84 | Temp 98.6°F | Resp 14 | Ht <= 58 in | Wt 159.6 lb

## 2013-03-31 DIAGNOSIS — Z4651 Encounter for fitting and adjustment of gastric lap band: Secondary | ICD-10-CM

## 2013-03-31 NOTE — Progress Notes (Signed)
  HISTORY: Belinda BlockVickie Pawloski is a 63 y.o.female who received an AP-Standard lap-band in February 2012 by Dr. Ezzard StandingNewman. She comes in with 2.5 lbs weight loss in the past two weeks. She had 2 mL fluid removed by Dr. Ezzard StandingNewman in October. She had 1.25 mL replace two weeks ago and is here for replacement of the remainder of fluid. She has no untoward symptoms.  VITAL SIGNS: Filed Vitals:   03/31/13 1340  BP: 140/84  Pulse: 84  Temp: 98.6 F (37 C)  Resp: 14    PHYSICAL EXAM: Physical exam reveals a very well-appearing 63 y.o.female in no apparent distress Neurologic: Awake, alert, oriented Psych: Bright affect, conversant Respiratory: Breathing even and unlabored. No stridor or wheezing Abdomen: Soft, nontender, nondistended to palpation. Incisions well-healed. No incisional hernias. Port easily palpated. Extremities: Atraumatic, good range of motion.  ASSESMENT: 63 y.o.  female  s/p AP-Standard lap-band.   PLAN: The patient's port was accessed with a 20G Huber needle without difficulty. Clear fluid was aspirated and 0.65 mL saline was added to the port to give a total predicted volume of 5.75 mL. The patient was able to swallow water without difficulty following the procedure and was instructed to take clear liquids for the next 24-48 hours and advance slowly as tolerated.

## 2013-03-31 NOTE — Patient Instructions (Signed)

## 2016-07-08 ENCOUNTER — Ambulatory Visit: Payer: Non-veteran care

## 2016-07-08 ENCOUNTER — Ambulatory Visit (INDEPENDENT_AMBULATORY_CARE_PROVIDER_SITE_OTHER): Payer: Non-veteran care | Admitting: Podiatry

## 2016-07-08 DIAGNOSIS — R52 Pain, unspecified: Secondary | ICD-10-CM | POA: Diagnosis not present

## 2016-07-08 DIAGNOSIS — M19079 Primary osteoarthritis, unspecified ankle and foot: Secondary | ICD-10-CM | POA: Diagnosis not present

## 2016-07-08 MED ORDER — METHYLPREDNISOLONE 4 MG PO TABS: 4.0000 mg | ORAL_TABLET | Freq: Every day | ORAL | 0 refills | Status: AC

## 2016-07-08 MED ORDER — CYCLOBENZAPRINE HCL 5 MG PO TABS: 5.0000 mg | ORAL_TABLET | Freq: Every day | ORAL | 1 refills | Status: DC

## 2016-07-09 LAB — ARTHRITIS PANEL
ANA: POSITIVE — AB
Rhuematoid fact SerPl-aCnc: 12.8 IU/mL (ref 0.0–13.9)
Sed Rate: 8 mm/hr (ref 0–40)
URIC ACID: 6.9 mg/dL (ref 2.5–7.1)

## 2016-07-09 NOTE — Progress Notes (Signed)
   HPI:  66 year old female presents today as a new patient for evaluation of bilateral pain to the top of her foot and at the base of the toes. Right is worse than left. Patient also expenses dull aching and cramping is been going on for approximately 1 year now. Pain is gradually gotten worse.    Physical Exam: General: The patient is alert and oriented x3 in no acute distress.  Dermatology: Skin is warm, dry and supple bilateral lower extremities. Negative for open lesions or macerations.  Vascular: Palpable pedal pulses bilaterally. No edema or erythema noted. Capillary refill within normal limits.  Neurological: Epicritic and protective threshold grossly intact bilaterally.   Musculoskeletal Exam: Diffuse pain on palpation noted throughout the bilateral lower extremities, more specifically the foot and ankle joints and musculoskeletal throughout the bilateral feet  Radiographic Exam:  Normal osseous mineralization. Joint spaces preserved. No fracture/dislocation/boney destruction.    Assessment: 1. Generalized lower extremity pain bilateral 2. Nocturnal leg cramps   Plan of Care:  1. Patient was evaluated. X-rays reviewed today 2. Prescription for Medrol Dosepak 3. Prescription for Flexeril 5 mg 4. Today orders were placed for an arthritic panel 5. Return to clinic in 4 weeks   Felecia ShellingBrent M. Franchesca Veneziano, DPM [facility]  Dr. Felecia ShellingBrent M. Traveon Louro, DPM    [address]                                        [place], [place] [address]                Office [phone]  Fax [phone]

## 2016-08-05 ENCOUNTER — Ambulatory Visit (INDEPENDENT_AMBULATORY_CARE_PROVIDER_SITE_OTHER): Payer: Non-veteran care | Admitting: Podiatry

## 2016-08-05 DIAGNOSIS — M19079 Primary osteoarthritis, unspecified ankle and foot: Secondary | ICD-10-CM | POA: Diagnosis not present

## 2016-08-06 NOTE — Progress Notes (Signed)
   HPI:  66 year old female presents today for follow up evaluation of BLE pain. She reports improving but continued pain in the bilateral feet, right worse than left. She has been taking Flexeril with moderate relief of the pain. She denies any new complaints at this time.   Physical Exam: General: The patient is alert and oriented x3 in no acute distress.  Dermatology: Skin is warm, dry and supple bilateral lower extremities. Negative for open lesions or macerations.  Vascular: Palpable pedal pulses bilaterally. No edema or erythema noted. Capillary refill within normal limits.  Neurological: Epicritic and protective threshold grossly intact bilaterally.   Musculoskeletal Exam: Diffuse pain on palpation noted throughout the bilateral lower extremities, more specifically the foot and ankle joints and musculoskeletal throughout the bilateral feet   Assessment: 1. Generalized lower extremity pain bilateral 2. Midfoot DJD bilaterally   Plan of Care:  1. Patient was evaluated. 2. Continue taking Flexeril 5 mg at bedtime. 3. Prescription for custom molded orthotics provided to take to [facility]. 4. Recommended follow up with Rheumatologist.  5. Return to clinic when necessary.    Felecia ShellingBrent M. Kelden Lavallee, DPM [facility]  Dr. Felecia ShellingBrent M. Jylian Pappalardo, DPM    [address]                                        [place], [place] [address]                Office [phone]  Fax [phone]

## 2016-10-17 ENCOUNTER — Other Ambulatory Visit: Payer: Self-pay

## 2016-10-17 DIAGNOSIS — M19079 Primary osteoarthritis, unspecified ankle and foot: Secondary | ICD-10-CM

## 2016-10-17 MED ORDER — CYCLOBENZAPRINE HCL 5 MG PO TABS: 5.0000 mg | ORAL_TABLET | Freq: Every day | ORAL | 1 refills | Status: AC

## 2016-10-28 ENCOUNTER — Encounter (HOSPITAL_COMMUNITY): Payer: Self-pay

## 2017-08-12 ENCOUNTER — Telehealth: Payer: Self-pay | Admitting: Gastroenterology

## 2017-08-12 NOTE — Telephone Encounter (Signed)
Referral in system from [facility] to schedule patient for procedure. Pease call pt to schedule.

## 2017-08-21 ENCOUNTER — Other Ambulatory Visit: Payer: Self-pay

## 2017-08-21 DIAGNOSIS — Z1211 Encounter for screening for malignant neoplasm of colon: Secondary | ICD-10-CM

## 2017-08-21 NOTE — Telephone Encounter (Signed)
[name] has scheduled patient for screening colonoscopy  On 09/08/17 with [facility].  Thanks [employer]

## 2017-08-26 ENCOUNTER — Telehealth: Payer: Self-pay | Admitting: Gastroenterology

## 2017-08-26 NOTE — Telephone Encounter (Signed)
Patient LVM for you to go ahead and authorize her prep through the [facility] now and they will pay for it. Please call patient and let her know you did it.

## 2017-08-27 ENCOUNTER — Telehealth: Payer: Self-pay

## 2017-08-27 NOTE — Telephone Encounter (Signed)
Returned patients call regarding authorization for bowel prep.  LVM informing her that normally we do not do prior authorizations for bowel preps-we send in a new rx for bowel prep that is covered by insurance.  Asked her to call me back to discuss.  Thanks [employer]

## 2017-08-31 ENCOUNTER — Other Ambulatory Visit: Payer: Self-pay

## 2017-09-08 ENCOUNTER — Ambulatory Visit: Payer: No Typology Code available for payment source | Admitting: Anesthesiology

## 2017-09-08 ENCOUNTER — Encounter: Payer: Self-pay | Admitting: *Deleted

## 2017-09-08 ENCOUNTER — Encounter
Admission: RE | Disposition: A | Discharge status: Home / Self Care | Payer: Self-pay | Source: Ambulatory Visit | Attending: Gastroenterology

## 2017-09-08 ENCOUNTER — Ambulatory Visit
Admission: RE | Admit: 2017-09-08 | Discharge: 2017-09-08 | Disposition: A | Discharge status: Home / Self Care | Payer: No Typology Code available for payment source | Source: Ambulatory Visit | Attending: Gastroenterology | Admitting: Gastroenterology

## 2017-09-08 DIAGNOSIS — E669 Obesity, unspecified: Secondary | ICD-10-CM | POA: Diagnosis not present

## 2017-09-08 DIAGNOSIS — Z1211 Encounter for screening for malignant neoplasm of colon: Secondary | ICD-10-CM

## 2017-09-08 DIAGNOSIS — F329 Major depressive disorder, single episode, unspecified: Secondary | ICD-10-CM | POA: Insufficient documentation

## 2017-09-08 DIAGNOSIS — K219 Gastro-esophageal reflux disease without esophagitis: Secondary | ICD-10-CM | POA: Diagnosis not present

## 2017-09-08 DIAGNOSIS — Z6839 Body mass index (BMI) 39.0-39.9, adult: Secondary | ICD-10-CM | POA: Diagnosis not present

## 2017-09-08 DIAGNOSIS — I10 Essential (primary) hypertension: Secondary | ICD-10-CM | POA: Diagnosis not present

## 2017-09-08 DIAGNOSIS — E785 Hyperlipidemia, unspecified: Secondary | ICD-10-CM | POA: Insufficient documentation

## 2017-09-08 DIAGNOSIS — E039 Hypothyroidism, unspecified: Secondary | ICD-10-CM | POA: Diagnosis not present

## 2017-09-08 HISTORY — DX: Major depressive disorder, single episode, unspecified: F32.9

## 2017-09-08 HISTORY — PX: COLONOSCOPY WITH PROPOFOL: SHX5780

## 2017-09-08 HISTORY — DX: Depression, unspecified: F32.A

## 2017-09-08 HISTORY — DX: Gastro-esophageal reflux disease without esophagitis: K21.9

## 2017-09-08 SURGERY — COLONOSCOPY WITH PROPOFOL
Anesthesia: General

## 2017-09-08 MED ORDER — PROPOFOL 500 MG/50ML IV EMUL
INTRAVENOUS | Status: DC | PRN
  Administered 2017-09-08: 120 ug/kg/min via INTRAVENOUS

## 2017-09-08 MED ORDER — LIDOCAINE HCL (PF) 1 % IJ SOLN
INTRAMUSCULAR | Status: AC
  Filled 2017-09-08: qty 2

## 2017-09-08 MED ORDER — PROPOFOL 10 MG/ML IV BOLUS
INTRAVENOUS | Status: DC | PRN
  Administered 2017-09-08: 30 mg via INTRAVENOUS
  Administered 2017-09-08: 70 mg via INTRAVENOUS

## 2017-09-08 MED ORDER — LIDOCAINE HCL (CARDIAC) PF 100 MG/5ML IV SOSY
PREFILLED_SYRINGE | INTRAVENOUS | Status: DC | PRN
  Administered 2017-09-08: 50 mg via INTRAVENOUS

## 2017-09-08 MED ORDER — SODIUM CHLORIDE 0.9 % IV SOLN: INTRAVENOUS | Status: DC

## 2017-09-08 NOTE — Transfer of Care (Signed)
Immediate Anesthesia Transfer of Care Note  Patient: [name]  Procedure(s) Performed: COLONOSCOPY WITH PROPOFOL (N/A )  Patient Location: PACU  Anesthesia Type:General  Level of Consciousness: sedated  Airway & Oxygen Therapy: Patient Spontanous Breathing and Patient connected to nasal cannula oxygen  Post-op Assessment: Report given to RN and Post -op Vital signs reviewed and stable  Post vital signs: Reviewed and stable  Last Vitals:  Vitals Value Taken Time  BP 111/54 09/08/2017  2:32 PM  Temp    Pulse 58 09/08/2017  2:32 PM  Resp 27 09/08/2017  2:32 PM  SpO2 95 % 09/08/2017  2:32 PM  Vitals shown include unvalidated device data.  Last Pain:  Vitals:   09/08/17 1248  TempSrc: Tympanic  PainSc: 0-No pain      Patients Stated Pain Goal: 0 (09/08/17 1248)  Complications: No apparent anesthesia complications

## 2017-09-08 NOTE — Anesthesia Procedure Notes (Signed)
Performed by: Vickii Volland, CRNA Pre-anesthesia Checklist: Patient identified, Emergency Drugs available, Suction available, Patient being monitored and Timeout performed Patient Re-evaluated:Patient Re-evaluated prior to induction Oxygen Delivery Method: Nasal cannula Induction Type: IV induction       

## 2017-09-08 NOTE — Anesthesia Postprocedure Evaluation (Signed)
Anesthesia Post Note  Patient: [name]  Procedure(s) Performed: COLONOSCOPY WITH PROPOFOL (N/A )  Patient location during evaluation: Endoscopy Anesthesia Type: General Level of consciousness: awake and alert and oriented Pain management: pain level controlled Vital Signs Assessment: post-procedure vital signs reviewed and stable Respiratory status: spontaneous breathing, nonlabored ventilation and respiratory function stable Cardiovascular status: blood pressure returned to baseline and stable Postop Assessment: no signs of nausea or vomiting Anesthetic complications: no     Last Vitals:  Vitals:   09/08/17 1432 09/08/17 1452  BP: (!) 111/54 (!) 128/59  Pulse: (!) 59 (!) 58  Resp: 19   Temp: (!) 36.1 C   SpO2: 94% 96%    Last Pain:  Vitals:   09/08/17 1452  TempSrc:   PainSc: 0-No pain                 Kishawn Pickar

## 2017-09-08 NOTE — Anesthesia Post-op Follow-up Note (Signed)
Anesthesia QCDR form completed.        

## 2017-09-08 NOTE — Op Note (Signed)
[facility] Patient Name: [name] Procedure Date: 09/08/2017 2:00 PM MRN: [id number] Account #: [id number] Date of Birth: [birth year] Admit Type: Outpatient Age: 67 Room: [facility] ENDO ROOM 4 Gender: Female Note Status: Finalized Procedure:            Colonoscopy Indications:          Screening for colorectal malignant neoplasm Providers:            Lin Landsman MD, MD Referring MD:         [facility], MD (Referring MD) Medicines:            Monitored Anesthesia Care Complications:        No immediate complications. Estimated blood loss: None. Procedure:            Pre-Anesthesia Assessment:                       - Prior to the procedure, a History and Physical was                        performed, and patient medications and allergies were                        reviewed. The patient is competent. The risks and                        benefits of the procedure and the sedation options and                        risks were discussed with the patient. All questions                        were answered and informed consent was obtained.                        Patient identification and proposed procedure were                        verified by the physician, the nurse, the                        anesthesiologist, the anesthetist and the technician in                        the pre-procedure area in the procedure room in the                        endoscopy suite. Mental Status Examination: alert and                        oriented. Airway Examination: normal oropharyngeal                        airway and neck mobility. Respiratory Examination:                        clear to auscultation. CV Examination: normal.                        Prophylactic Antibiotics: The patient does not require  prophylactic antibiotics. Prior Anticoagulants: The                        patient has taken no previous anticoagulant or                         antiplatelet agents. ASA Grade Assessment: II - A                        patient with mild systemic disease. After reviewing the                        risks and benefits, the patient was deemed in                        satisfactory condition to undergo the procedure. The                        anesthesia plan was to use monitored anesthesia care                        (MAC). Immediately prior to administration of                        medications, the patient was re-assessed for adequacy                        to receive sedatives. The heart rate, respiratory rate,                        oxygen saturations, blood pressure, adequacy of                        pulmonary ventilation, and response to care were                        monitored throughout the procedure. The physical status                        of the patient was re-assessed after the procedure.                       After obtaining informed consent, the colonoscope was                        passed under direct vision. Throughout the procedure,                        the patient's blood pressure, pulse, and oxygen                        saturations were monitored continuously. The                        Colonoscope was introduced through the anus and                        advanced to the the cecum, identified by appendiceal  orifice and ileocecal valve. The colonoscopy was                        somewhat difficult due to significant looping and the                        patient's body habitus. Successful completion of the                        procedure was aided by applying abdominal pressure. The                        patient tolerated the procedure well. The quality of                        the bowel preparation was evaluated using the BBPS                        [facility] Bowel Preparation Scale) with scores of: Right                        Colon = 3, Transverse Colon = 3 and Left  Colon = 3                        (entire mucosa seen well with no residual staining,                        small fragments of stool or opaque liquid). The total                        BBPS score equals 9. Findings:      The perianal and digital rectal examinations were normal. Pertinent       negatives include normal sphincter tone and no palpable rectal lesions.      The colon (entire examined portion) appeared normal.      The retroflexed view of the distal rectum and anal verge was normal and       showed no anal or rectal abnormalities. Impression:           - The entire examined colon is normal.                       - The distal rectum and anal verge are normal on                        retroflexion view.                       - No specimens collected. Recommendation:       - Discharge patient to home (with escort).                       - Low sodium diet today.                       - Continue present medications.                       - Repeat colonoscopy in 10 years for surveillance. Procedure Code(s):    --- Professional ---  P5465, Colorectal cancer screening; colonoscopy on                        individual not meeting criteria for high risk Diagnosis Code(s):    --- Professional ---                       Z12.11, Encounter for screening for malignant neoplasm                        of colon CPT copyright 2017 American Medical Association. All rights reserved. The codes documented in this report are preliminary and upon coder review may  be revised to meet current compliance requirements. Dr. Ulyess Mort Lin Landsman MD, MD 09/08/2017 2:30:37 PM This report has been signed electronically. Number of Addenda: 0 Note Initiated On: 09/08/2017 2:00 PM Scope Withdrawal Time: 0 hours 9 minutes 31 seconds  Total Procedure Duration: 0 hours 16 minutes 51 seconds       [facility]

## 2017-09-08 NOTE — Anesthesia Preprocedure Evaluation (Signed)
Anesthesia Evaluation  Patient identified by MRN, date of birth, ID band Patient awake    Reviewed: Allergy & Precautions, NPO status , Patient's Chart, lab work & pertinent test results  History of Anesthesia Complications Negative for: history of anesthetic complications  Airway Mallampati: I  TM Distance: >3 FB Neck ROM: Full    Dental  (+) Upper Dentures   Pulmonary neg pulmonary ROS, neg sleep apnea, neg COPD,    breath sounds clear to auscultation- rhonchi (-) wheezing      Cardiovascular Exercise Tolerance: Good hypertension, Pt. on medications (-) CAD, (-) Past MI, (-) Cardiac Stents and (-) CABG  Rhythm:Regular Rate:Normal - Systolic murmurs and - Diastolic murmurs    Neuro/Psych PSYCHIATRIC DISORDERS Depression negative neurological ROS     GI/Hepatic Neg liver ROS, GERD  ,  Endo/Other  neg diabetesHypothyroidism   Renal/GU negative Renal ROS     Musculoskeletal negative musculoskeletal ROS (+)   Abdominal (+) + obese,   Peds  Hematology negative hematology ROS (+)   Anesthesia Other Findings Past Medical History: No date: Depression No date: GERD (gastroesophageal reflux disease) No date: Hyperlipidemia No date: Hypertension 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 No date: Thyroid disease   Reproductive/Obstetrics                             Anesthesia Physical Anesthesia Plan  ASA: II  Anesthesia Plan: General   Post-op Pain Management:    Induction: Intravenous  PONV Risk Score and Plan: 2 and Propofol infusion  Airway Management Planned: Natural Airway  Additional Equipment:   Intra-op Plan:   Post-operative Plan:   Informed Consent: I have reviewed the patients History and Physical, chart, labs and discussed the procedure including the risks, benefits and alternatives for the proposed anesthesia with the patient or authorized representative who has indicated his/her  understanding and acceptance.   Dental advisory given  Plan Discussed with: CRNA and Anesthesiologist  Anesthesia Plan Comments:         Anesthesia Quick Evaluation

## 2017-09-08 NOTE — H&P (Signed)
Arlyss Repress, MD [address]  [address]  [place], [place] [address]  Main: [phone]  Fax: [phone] Pager: [phone]  Primary Care Physician:  [facility] Primary Gastroenterologist:  Dr. Arlyss Repress  Pre-Procedure History & Physical: HPI:  [name] is a 67 y.o. female is here for an colonoscopy.   Past Medical History:  Diagnosis Date  . Depression   . GERD (gastroesophageal reflux disease)   . Hyperlipidemia   . Hypertension   . Hypophosphatemia   . Thyroid disease     Past Surgical History:  Procedure Laterality Date  . ABDOMINAL HYSTERECTOMY    . APPENDECTOMY    . KNEE SURGERY    . NASAL SINUS SURGERY    . TONSILLECTOMY      Prior to Admission medications   Medication Sig Start Date End Date Taking? Authorizing Provider  Ascorbic Acid (VITAMIN C) 1000 MG tablet Take 1,000 mg by mouth daily.     Yes [provider]  calcitRIOL (ROCALTROL) 0.25 MCG capsule Take 0.25 mcg by mouth daily.     Yes [provider]  Calcium Citrate-Vitamin D (CITRACAL + D PO) Take by mouth daily. 3xwk   Yes [provider]  cetirizine (ZYRTEC) 10 MG tablet Take 10 mg by mouth daily.     Yes [provider]  cyclobenzaprine (FLEXERIL) 5 MG tablet Take 1 tablet (5 mg total) by mouth at bedtime. 10/17/16  Yes Felecia Shelling, DPM  escitalopram (LEXAPRO) 10 MG tablet Take 10 mg by mouth daily.   Yes [provider]  gabapentin (NEURONTIN) 300 MG capsule Take 300 mg by mouth 3 (three) times daily.   Yes [provider]  glucosamine-chondroitin 500-400 MG tablet Take 1 tablet by mouth 3 (three) times daily.     Yes [provider]  levothyroxine (SYNTHROID, LEVOTHROID) 112 MCG tablet Take 125 mcg by mouth daily.    Yes [provider]  methylPREDNISolone (MEDROL) 4 MG tablet Take 1 tablet (4 mg total) by mouth daily. Take as directed 07/08/16  Yes Felecia Shelling, DPM    multivitamin-iron-minerals-folic acid (CENTRUM) chewable tablet Chew 1 tablet by mouth daily.     Yes [provider]  omeprazole (PRILOSEC) 10 MG capsule Take 10 mg by mouth daily.   Yes [provider]  prazosin (MINIPRESS) 2 MG capsule Take 2 mg by mouth at bedtime.   Yes [provider]  ranitidine (ZANTAC) 75 MG tablet Take 75 mg by mouth 2 (two) times daily.   Yes [provider]  phosphorus (K PHOS NEUTRAL) 155-852-130 MG tablet Take 1 tablet by mouth 2 (two) times daily. 250 neutral    [provider]    Allergies as of 08/21/2017  . (No Known Allergies)    Family History  Problem Relation Age of Onset  . Hypertension Mother   . Hyperlipidemia Mother   . Hyperlipidemia Father   . Hypertension Father   . Cancer Father        liver  . Other Father        hypophosphatemia    Social History   Socioeconomic History  . Marital status: Single    Spouse name: Not on file  . Number of children: Not on file  . Years of education: Not on file  . Highest education level: Not on file  Occupational History  . Not on file  Social Needs  . Financial resource strain: Not on file  .  Food insecurity:    Worry: Not on file    Inability: Not on file  . Transportation needs:    Medical: Not on file    Non-medical: Not on file  Tobacco Use  . Smoking status: Never Smoker  . Smokeless tobacco: Never Used  Substance and Sexual Activity  . Alcohol use: Yes    Comment: socially  . Drug use: No  . Sexual activity: Not on file  Lifestyle  . Physical activity:    Days per week: Not on file    Minutes per session: Not on file  . Stress: Not on file  Relationships  . Social connections:    Talks on phone: Not on file    Gets together: Not on file    Attends religious service: Not on file    Active member of club or organization: Not on file    Attends meetings of clubs or organizations: Not on file    Relationship status: Not on file   . Intimate partner violence:    Fear of current or ex partner: Not on file    Emotionally abused: Not on file    Physically abused: Not on file    Forced sexual activity: Not on file  Other Topics Concern  . Not on file  Social History Narrative  . Not on file    Review of Systems: See HPI, otherwise negative ROS  Physical Exam: BP (!) 143/85   Pulse 65   Temp (!) 97.5 F (36.4 C) (Tympanic)   Resp 20   Ht 4\' 10"  (1.473 m)   Wt 190 lb (86.2 kg)   SpO2 99%   BMI 39.71 kg/m  General:   Alert,  pleasant and cooperative in NAD Head:  Normocephalic and atraumatic. Neck:  Supple; no masses or thyromegaly. Lungs:  Clear throughout to auscultation.    Heart:  Regular rate and rhythm. Abdomen:  Soft, nontender and nondistended. Normal bowel sounds, without guarding, and without rebound.   Neurologic:  Alert and  oriented x4;  grossly normal neurologically.  Impression/Plan: [name] is here for an colonoscopy to be performed for colon cancer screening  Risks, benefits, limitations, and alternatives regarding  colonoscopy have been reviewed with the patient.  Questions have been answered.  All parties agreeable.   Lannette Donathohini Jordan Caraveo, MD  09/08/2017, 1:57 PM

## 2017-10-20 ENCOUNTER — Encounter (HOSPITAL_COMMUNITY): Payer: Self-pay

## 2021-06-27 ENCOUNTER — Encounter: Payer: Self-pay | Admitting: Emergency Medicine

## 2021-06-27 ENCOUNTER — Ambulatory Visit
Admission: EM | Admit: 2021-06-27 | Discharge: 2021-06-27 | Disposition: A | Discharge status: Home / Self Care | Payer: Medicare HMO | Attending: Emergency Medicine | Admitting: Emergency Medicine

## 2021-06-27 DIAGNOSIS — H6501 Acute serous otitis media, right ear: Secondary | ICD-10-CM | POA: Diagnosis not present

## 2021-06-27 DIAGNOSIS — H60332 Swimmer's ear, left ear: Secondary | ICD-10-CM

## 2021-06-27 MED ORDER — HYDROCORTISONE-ACETIC ACID 1-2 % OT SOLN: 3.0000 [drp] | Freq: Two times a day (BID) | OTIC | 0 refills | Status: AC

## 2021-06-27 MED ORDER — AMOXICILLIN 500 MG PO CAPS: 500.0000 mg | ORAL_CAPSULE | Freq: Two times a day (BID) | ORAL | 0 refills | Status: AC

## 2021-06-27 NOTE — Discharge Instructions (Signed)
Today you are being treated for an infection of the eardrum on the right side for outer ear infection on the left side  Take amoxicillin twice daily for 10 days, you should begin to see improvement after 48 hours of medication use and then it should progressively get better  Placed 3 drops into the left ear twice daily for 7 days, this medication will help to reduce inflammation to the ear canal You may use Tylenol or ibuprofen for management of discomfort  May hold warm compresses to the ear for additional comfort  Please not attempted any ear cleaning or object or fluid placement into the ear canal to prevent further irritation   Follow-up with urgent care or your primary doctor for reevaluation if your symptoms continue to persist or worsen

## 2021-06-27 NOTE — ED Triage Notes (Signed)
Pt presents with bilateral ear pain x 1 week.  

## 2021-06-27 NOTE — ED Provider Notes (Signed)
[name]    CSN: [id number] Arrival date & time: 06/27/21  1230      History   Chief Complaint Chief Complaint  Patient presents with   Otalgia    HPI [name] is a 71 y.o. female.   Patient presents with bilateral ear pain for 2 weeks, left side worse than right.  Associated ear itching.  Has attempted use of all of oral and ear cleaning which has been ineffective.  Endorses nasal congestion but this is not a new symptom.  Denies fever, chills, body aches, ear drainage, decreased hearing, sore throat, headaches, cough.  Past Medical History:  Diagnosis Date   Depression    GERD (gastroesophageal reflux disease)    Hyperlipidemia    Hypertension    Hypophosphatemia    Thyroid disease     Patient Active Problem List   Diagnosis Date Noted   Encounter for screening colonoscopy    History of laparoscopic adjustable gastric banding, 05/25/2010 12/02/2012   Menopause 08/09/2010   Hormone replacement therapy (postmenopausal) 08/09/2010    Past Surgical History:  Procedure Laterality Date   ABDOMINAL HYSTERECTOMY     APPENDECTOMY     COLONOSCOPY WITH PROPOFOL N/A 09/08/2017   Procedure: COLONOSCOPY WITH PROPOFOL;  Surgeon: Lin Landsman, MD;  Location: [facility];  Service: Gastroenterology;  Laterality: N/A;   KNEE SURGERY     NASAL SINUS SURGERY     TONSILLECTOMY      OB History   No obstetric history on file.      Home Medications    Prior to Admission medications   Medication Sig Start Date End Date Taking? Authorizing Provider  acetic acid-hydrocortisone (VOSOL-HC) OTIC solution Place 3 drops into the left ear 2 (two) times daily. 06/27/21  Yes Karianna Gusman R, NP  amoxicillin (AMOXIL) 500 MG capsule Take 1 capsule (500 mg total) by mouth 2 (two) times daily for 10 days. 06/27/21 07/07/21 Yes Jarvis Knodel R, NP  Ascorbic Acid (VITAMIN C) 1000 MG tablet Take 1,000 mg by mouth daily.      [provider]  calcitRIOL  (ROCALTROL) 0.25 MCG capsule Take 0.25 mcg by mouth daily.      [provider]  Calcium Citrate-Vitamin D (CITRACAL + D PO) Take by mouth daily. 3xwk    [provider]  cetirizine (ZYRTEC) 10 MG tablet Take 10 mg by mouth daily.      [provider]  cyclobenzaprine (FLEXERIL) 5 MG tablet Take 1 tablet (5 mg total) by mouth at bedtime. 10/17/16   Edrick Kins, DPM  escitalopram (LEXAPRO) 10 MG tablet Take 10 mg by mouth daily.    [provider]  gabapentin (NEURONTIN) 300 MG capsule Take 300 mg by mouth 3 (three) times daily.    [provider]  glucosamine-chondroitin 500-400 MG tablet Take 1 tablet by mouth 3 (three) times daily.      [provider]  levothyroxine (SYNTHROID, LEVOTHROID) 112 MCG tablet Take 125 mcg by mouth daily.     [provider]  methylPREDNISolone (MEDROL) 4 MG tablet Take 1 tablet (4 mg total) by mouth daily. Take as directed 07/08/16   Edrick Kins, DPM  multivitamin-iron-minerals-folic acid (CENTRUM) chewable tablet Chew 1 tablet by mouth daily.      [provider]  omeprazole (PRILOSEC) 10 MG capsule Take 10 mg by mouth daily.    [provider]  phosphorus (K PHOS NEUTRAL) 155-852-130 MG tablet Take 1 tablet by mouth  2 (two) times daily. 250 neutral    [provider]  prazosin (MINIPRESS) 2 MG capsule Take 2 mg by mouth at bedtime.    [provider]  ranitidine (ZANTAC) 75 MG tablet Take 75 mg by mouth 2 (two) times daily.    [provider]    Family History Family History  Problem Relation Age of Onset   Hypertension Mother    Hyperlipidemia Mother    Hyperlipidemia Father    Hypertension Father    Cancer Father        liver   Other Father        hypophosphatemia    Social History Social History   Tobacco Use   Smoking status: Never   Smokeless tobacco: Never  Vaping Use   Vaping Use: Never used  Substance Use Topics   Alcohol use:  Yes    Comment: socially   Drug use: No     Allergies   Patient has no known allergies.   Review of Systems Review of Systems Defer to HPI    Physical Exam Triage Vital Signs ED Triage Vitals  Enc Vitals Group     BP 06/27/21 1244 (!) 114/58     Pulse Rate 06/27/21 1244 63     Resp 06/27/21 1244 16     Temp 06/27/21 1244 98.3 F (36.8 C)     Temp Source 06/27/21 1244 Oral     SpO2 06/27/21 1244 98 %     Weight --      Height --      Head Circumference --      Peak Flow --      Pain Score 06/27/21 1242 7     Pain Loc --      Pain Edu? --      Excl. in [place]? --    No data found.  Updated Vital Signs BP (!) 114/58 (BP Location: Left Arm)   Pulse 63   Temp 98.3 F (36.8 C) (Oral)   Resp 16   SpO2 98%   Visual Acuity Right Eye Distance:   Left Eye Distance:   Bilateral Distance:    Right Eye Near:   Left Eye Near:    Bilateral Near:     Physical Exam Constitutional:      Appearance: Normal appearance.  HENT:     Head: Normocephalic.     Right Ear: Hearing, ear canal and external ear normal. Tympanic membrane is erythematous.     Left Ear: Tympanic membrane and external ear normal.     Ears:     Comments: Mild to moderate swelling of the left ear canal with Josiephine Simao drainage noted, no involvement of the tympanic membrane    Nose: Congestion present. No rhinorrhea.     Mouth/Throat:     Mouth: Mucous membranes are moist.     Pharynx: Oropharynx is clear.  Eyes:     Extraocular Movements: Extraocular movements intact.  Pulmonary:     Effort: Pulmonary effort is normal.  Neurological:     Mental Status: She is alert and oriented to person, place, and time. Mental status is at baseline.  Psychiatric:        Mood and Affect: Mood normal.        Behavior: Behavior normal.     UC Treatments / Results  Labs (all labs ordered are listed, but only abnormal results are displayed) Labs Reviewed - No data to display  EKG   Radiology No results  found.  Procedures Procedures (including critical care time)  Medications Ordered in UC Medications - No data to display  Initial Impression / Assessment and Plan / UC Course  I have reviewed the triage vital signs and the nursing notes.  Pertinent labs & imaging results that were available during my care of the patient were reviewed by me and considered in my medical decision making (see chart for details).  Acute swimmers ear left side Nonrecurrent acute serous otitis media of right ear  Abnormalities noted to both ears, discussed findings with patient, amoxicillin 10-day course prescribed for right inner ear infection and acetic acid hydrocortisone otic drops prescribed for left outer ear infection, discussed administration of medications with patient, recommended Tylenol or ibuprofen as needed for general discomfort, may also hold warm compresses to the ear, advised against any ear cleaning or fluid or object placement into the canal until all medication is complete and symptoms have resolved, may follow-up with urgent care as needed for recurrence  Final Clinical Impressions(s) / UC Diagnoses   Final diagnoses:  Acute swimmer's ear of left side  Non-recurrent acute serous otitis media of right ear     Discharge Instructions      Today you are being treated for an infection of the eardrum on the right side for outer ear infection on the left side  Take amoxicillin twice daily for 10 days, you should begin to see improvement after 48 hours of medication use and then it should progressively get better  Placed 3 drops into the left ear twice daily for 7 days, this medication will help to reduce inflammation to the ear canal You may use Tylenol or ibuprofen for management of discomfort  May hold warm compresses to the ear for additional comfort  Please not attempted any ear cleaning or object or fluid placement into the ear canal to prevent further irritation   Follow-up with  urgent care or your primary doctor for reevaluation if your symptoms continue to persist or worsen   ED Prescriptions     Medication Sig Dispense Auth. Provider   amoxicillin (AMOXIL) 500 MG capsule Take 1 capsule (500 mg total) by mouth 2 (two) times daily for 10 days. 20 capsule Chrishauna Mee R, NP   acetic acid-hydrocortisone (VOSOL-HC) OTIC solution Place 3 drops into the left ear 2 (two) times daily. 10 mL Hans Eden, NP      PDMP not reviewed this encounter.   Hans Eden, NP 06/27/21 1407
